# Patient Record
Sex: Female | Born: 1960
Health system: Southern US, Community
[De-identification: ages and names within clinical notes are randomized; demographics above are authoritative.]

## PROBLEM LIST (undated history)

## (undated) DIAGNOSIS — G4700 Insomnia, unspecified: Secondary | ICD-10-CM

## (undated) DIAGNOSIS — R519 Headache, unspecified: Secondary | ICD-10-CM

## (undated) DIAGNOSIS — R7303 Prediabetes: Secondary | ICD-10-CM

## (undated) DIAGNOSIS — E7800 Pure hypercholesterolemia, unspecified: Secondary | ICD-10-CM

## (undated) DIAGNOSIS — N959 Unspecified menopausal and perimenopausal disorder: Secondary | ICD-10-CM

## (undated) DIAGNOSIS — R002 Palpitations: Secondary | ICD-10-CM

## (undated) DIAGNOSIS — F419 Anxiety disorder, unspecified: Secondary | ICD-10-CM

## (undated) DIAGNOSIS — C801 Malignant (primary) neoplasm, unspecified: Secondary | ICD-10-CM

## (undated) HISTORY — PX: UPPER GASTROINTESTINAL ENDOSCOPY: SHX188

## (undated) HISTORY — PX: KNEE SURGERY: SHX244

## (undated) HISTORY — PX: COLONOSCOPY: SHX174

---

## 2007-03-09 ENCOUNTER — Ambulatory Visit: Payer: Self-pay | Admitting: Gastroenterology

## 2008-06-21 ENCOUNTER — Ambulatory Visit: Payer: Self-pay

## 2008-06-23 ENCOUNTER — Ambulatory Visit: Payer: Self-pay | Admitting: Orthopedic Surgery

## 2008-08-29 ENCOUNTER — Ambulatory Visit: Payer: Self-pay | Admitting: Orthopedic Surgery

## 2011-10-02 ENCOUNTER — Ambulatory Visit: Payer: Self-pay

## 2012-10-06 ENCOUNTER — Ambulatory Visit: Payer: Self-pay

## 2013-12-30 ENCOUNTER — Ambulatory Visit: Payer: Self-pay | Admitting: Family Medicine

## 2015-04-19 ENCOUNTER — Ambulatory Visit
Admission: EM | Admit: 2015-04-19 | Discharge: 2015-04-19 | Disposition: A | Discharge status: Home / Self Care | Payer: 59 | Attending: Family Medicine | Admitting: Family Medicine

## 2015-04-19 ENCOUNTER — Encounter: Payer: Self-pay | Admitting: Gynecology

## 2015-04-19 DIAGNOSIS — L03116 Cellulitis of left lower limb: Secondary | ICD-10-CM

## 2015-04-19 HISTORY — DX: Unspecified menopausal and perimenopausal disorder: N95.9

## 2015-04-19 MED ORDER — SULFAMETHOXAZOLE-TRIMETHOPRIM 800-160 MG PO TABS: 1.0000 | ORAL_TABLET | Freq: Two times a day (BID) | ORAL | Status: AC

## 2015-04-19 NOTE — ED Provider Notes (Signed)
CSN: [id number]     Arrival date & time 04/19/15  1642 History   First MD Initiated Contact with Patient 04/19/15 1733     Chief Complaint  Patient presents with  . Suture / Staple Removal   (Consider location/radiation/quality/duration/timing/severity/associated sxs/prior Treatment) HPI Comments: 55 yo female seen at [facility] ER on 04/09/15 after sustaining a fall and anterior left leg laceration. Patient states had 2 superficial sutures placed. States thinks one of them may have fallen off. Complains of some mild pain and slight pus drainage from wound. Denies any fevers or chills.   Patient is a 55 y.o. female presenting with suture removal. The history is provided by the patient.  Suture / Staple Removal    Past Medical History  Diagnosis Date  . Menopausal problem    Past Surgical History  Procedure Laterality Date  . Knee surgery Left    No family history on file. Social History  Substance Use Topics  . Smoking status: Never Smoker   . Smokeless tobacco: None  . Alcohol Use: No   OB History    No data available     Review of Systems  Allergies  Review of patient's allergies indicates no known allergies.  Home Medications   Prior to Admission medications   Medication Sig Start Date End Date Taking? Authorizing Provider  venlafaxine (EFFEXOR) 75 MG tablet Take 75 mg by mouth 2 (two) times daily.   Yes Historical Provider, MD  sulfamethoxazole-trimethoprim (BACTRIM DS,SEPTRA DS) 800-160 MG tablet Take 1 tablet by mouth 2 (two) times daily. 04/19/15   Norval Gable, MD   Meds Ordered and Administered this Visit  Medications - No data to display  BP 121/86 mmHg  Pulse 88  Temp(Src) 98.1 F (36.7 C) (Oral)  Resp 16  Ht 6' (1.829 m)  Wt 180 lb (81.647 kg)  BMI 24.41 kg/m2  SpO2 100% No data found.   Physical Exam  Constitutional: She appears well-developed and well-nourished. No distress.  Skin: She is not diaphoretic.  Left knee normal range of motion;  neurovascularly intact; anterior shin wound healing with mild surrounding blanchable erythema, mild tenderness to palpation and mild drainage  Nursing note and vitals reviewed.   ED Course  Procedures (including critical care time)  Labs Review Labs Reviewed - No data to display  Imaging Review No results found.   Visual Acuity Review  Right Eye Distance:   Left Eye Distance:   Bilateral Distance:    Right Eye Near:   Left Eye Near:    Bilateral Near:         MDM   1. Cellulitis of leg, left   (mild)   New Prescriptions   SULFAMETHOXAZOLE-TRIMETHOPRIM (BACTRIM DS,SEPTRA DS) 800-160 MG TABLET    Take 1 tablet by mouth 2 (two) times daily.   1. diagnosis reviewed with patient; 1 superficial suture removed from wound; no other sutures visualized 2. rx as per orders above; reviewed possible side effects, interactions, risks and benefits  3. Recommend supportive treatment with wound care 4. Follow-up prn if symptoms worsen or don't improve    Norval Gable, MD 04/19/15 [phone]

## 2015-04-19 NOTE — ED Notes (Signed)
Patient c/o fell on 04/09/2015 and injury right shoulder bruise and bilateral palm bruised. Patient stated was seen at [facility] ER on 04/09/2015 and and 2 stiches was place in left knee and told she need to have them taken out in 10 days.

## 2015-05-04 ENCOUNTER — Ambulatory Visit
Admission: EM | Admit: 2015-05-04 | Discharge: 2015-05-04 | Disposition: A | Discharge status: Home / Self Care | Payer: 59 | Attending: Family Medicine | Admitting: Family Medicine

## 2015-05-04 ENCOUNTER — Encounter: Payer: Self-pay | Admitting: Emergency Medicine

## 2015-05-04 ENCOUNTER — Ambulatory Visit (INDEPENDENT_AMBULATORY_CARE_PROVIDER_SITE_OTHER)
Admit: 2015-05-04 | Discharge: 2015-05-04 | Disposition: A | Discharge status: Home / Self Care | Payer: 59 | Attending: Family Medicine | Admitting: Family Medicine

## 2015-05-04 DIAGNOSIS — L03116 Cellulitis of left lower limb: Secondary | ICD-10-CM

## 2015-05-04 DIAGNOSIS — G4482 Headache associated with sexual activity: Secondary | ICD-10-CM | POA: Diagnosis not present

## 2015-05-04 DIAGNOSIS — R519 Headache, unspecified: Secondary | ICD-10-CM

## 2015-05-04 DIAGNOSIS — G43009 Migraine without aura, not intractable, without status migrainosus: Secondary | ICD-10-CM

## 2015-05-04 DIAGNOSIS — R51 Headache: Secondary | ICD-10-CM

## 2015-05-04 DIAGNOSIS — N390 Urinary tract infection, site not specified: Secondary | ICD-10-CM | POA: Diagnosis not present

## 2015-05-04 LAB — URINALYSIS COMPLETE WITH MICROSCOPIC (ARMC ONLY)
BILIRUBIN URINE: NEGATIVE
GLUCOSE, UA: NEGATIVE mg/dL
KETONES UR: NEGATIVE mg/dL
NITRITE: NEGATIVE
Protein, ur: NEGATIVE mg/dL
Specific Gravity, Urine: 1.005 (ref 1.005–1.030)
pH: 5.5 (ref 5.0–8.0)

## 2015-05-04 MED ORDER — PHENAZOPYRIDINE HCL 200 MG PO TABS: 200.0000 mg | ORAL_TABLET | Freq: Three times a day (TID) | ORAL | Status: AC | PRN

## 2015-05-04 MED ORDER — SUMATRIPTAN SUCCINATE 100 MG PO TABS: 100.0000 mg | ORAL_TABLET | Freq: Once | ORAL | Status: AC

## 2015-05-04 MED ORDER — SUMATRIPTAN SUCCINATE 100 MG PO TABS: 100.0000 mg | ORAL_TABLET | ORAL | Status: DC | PRN

## 2015-05-04 MED ORDER — FLUCONAZOLE 150 MG PO TABS: 150.0000 mg | ORAL_TABLET | Freq: Once | ORAL | Status: AC

## 2015-05-04 MED ORDER — MUPIROCIN 2 % EX OINT: 1.0000 "application " | TOPICAL_OINTMENT | Freq: Three times a day (TID) | CUTANEOUS | Status: AC

## 2015-05-04 MED ORDER — LEVOFLOXACIN 500 MG PO TABS: 500.0000 mg | ORAL_TABLET | Freq: Every day | ORAL | Status: AC

## 2015-05-04 NOTE — ED Provider Notes (Signed)
CSN: [id number]     Arrival date & time 05/04/15  [id number] History   First MD Initiated Contact with Patient 05/04/15 [phone]     Nurses notes were reviewed.  Chief Complaint  Patient presents with  . Urinary Tract Infection  . Knee Pain     #1 knee infection. Patient fell a few weeks ago she was seen here after [facility] place on [place]. Patient states she is just got through with Septra a few days ago but she still concerned about the wound she states that when she puts a bandage over it as she was ptosis during the day at work she still noticing some drainage coming from the wound.  #2 she is also concerned by UTI states she started having dysuria strong smelling urine and frequency yesterday and pains worse today. She does write down we talked about the importance of going to the bathroom before she has sexual relations not after she also has a history of yeast infections after taking a box as well and was wondering if this is from yeast infection  #3 post coitus/orgasmic headache. Almost 2 weeks ago patient had sexual relations with her husband and after reaching climax had excruciating headache which caused her to lay down she states that she had nausea lightheadedness and dizziness. The headache lasted for a while and the next day she still felt washed out and drained. She's had a history of migraine headaches before and it felt like when she had her migraine headaches before. 2 days ago she had sexual relations again and that she was about to reach climax she felt that as she put it the way going up into her head and she had her husband stop and the headache did not manifest itself at that time. But she felt like she was about to have a headache. She is looked at [facility] M[facility]. and has some concern of course.    (Consider location/radiation/quality/duration/timing/severity/associated sxs/prior Treatment) Patient is a 55 y.o. female presenting with urinary tract infection, knee pain, abscess, and  migraines. The history is provided by the patient. No language interpreter was used.  Urinary Tract Infection Pain quality:  Sharp Pain severity:  Moderate Progression:  Worsening Chronicity:  New Relieved by:  None tried Ineffective treatments:  Antibiotics Urinary symptoms: discolored urine   Urinary symptoms: no frequent urination   Associated symptoms: no abdominal pain and no vaginal discharge   Risk factors: sexually active   Knee Pain Abscess Location:  Leg Leg abscess location:  L knee Abscess quality: painful, redness and warmth   Progression:  Partially resolved Pain details:    Severity:  Mild Chronicity:  New Relieved by:  Oral antibiotics Worsened by:  Draining/squeezing Associated symptoms: headaches   Headaches:    Severity:  Severe   Onset quality:  Sudden   Progression:  Resolved   Chronicity:  Recurrent Risk factors: no family hx of MRSA, no hx of MRSA and no prior abscess   Migraine This is a new problem. The current episode started more than 1 week ago. Associated symptoms include headaches. Pertinent negatives include no chest pain, no abdominal pain and no shortness of breath. The symptoms are aggravated by intercourse. Nothing relieves the symptoms. She has tried nothing for the symptoms.    Patient does not smoke. There is no family history this pertinent as far as migraines or if UTI is concerned. She does report having gone through the change of life and still having hot flashes  Past Medical History  Diagnosis Date  . Menopausal problem    Past Surgical History  Procedure Laterality Date  . Knee surgery Left    No family history on file. Social History  Substance Use Topics  . Smoking status: Never Smoker   . Smokeless tobacco: None  . Alcohol Use: No   OB History    No data available     Review of Systems  Respiratory: Negative for shortness of breath.   Cardiovascular: Negative for chest pain.  Gastrointestinal: Negative for  abdominal pain.  Genitourinary: Positive for urgency, frequency, difficulty urinating and menstrual problem. Negative for vaginal bleeding, vaginal discharge and vaginal pain.  Skin: Positive for wound.  Neurological: Positive for headaches.  All other systems reviewed and are negative.   Allergies  Review of patient's allergies indicates no known allergies.  Home Medications   Prior to Admission medications   Medication Sig Start Date End Date Taking? Authorizing Provider  fluconazole (DIFLUCAN) 150 MG tablet Take 1 tablet (150 mg total) by mouth once. 05/04/15   Frederich Cha, MD  levofloxacin (LEVAQUIN) 500 MG tablet Take 1 tablet (500 mg total) by mouth daily. 05/04/15   Frederich Cha, MD  mupirocin ointment (BACTROBAN) 2 % Apply 1 application topically 3 (three) times daily. 05/04/15   Frederich Cha, MD  phenazopyridine (PYRIDIUM) 200 MG tablet Take 1 tablet (200 mg total) by mouth 3 (three) times daily as needed for pain. 05/04/15   Frederich Cha, MD  sulfamethoxazole-trimethoprim (BACTRIM DS,SEPTRA DS) 800-160 MG tablet Take 1 tablet by mouth 2 (two) times daily. 04/19/15   Norval Gable, MD  SUMAtriptan (IMITREX) 100 MG tablet Take 1 tablet (100 mg total) by mouth once. May repeat in 2 hours if headache persists or recurs.Can also take an hour before any activities that precipitates migraines. Maximum 2 tablets in 24 hours. 05/04/15   Frederich Cha, MD  venlafaxine (EFFEXOR) 75 MG tablet Take 75 mg by mouth 2 (two) times daily.    Historical Provider, MD   Meds Ordered and Administered this Visit  Medications - No data to display  BP 152/111 mmHg  Pulse 92  Temp(Src) 97.9 F (36.6 C) (Oral)  Resp 18  Ht 6' (1.829 m)  Wt 180 lb (81.647 kg)  BMI 24.41 kg/m2  SpO2 97% No data found.   Physical Exam  Constitutional: She is oriented to person, place, and time. She appears well-developed and well-nourished.  HENT:  Head: Normocephalic and atraumatic.  Right Ear: External ear normal.  Left  Ear: External ear normal.  Eyes: Conjunctivae are normal. Pupils are equal, round, and reactive to light.  Neck: Normal range of motion.  Abdominal: Normal appearance and bowel sounds are normal. There is no hepatosplenomegaly. There is no CVA tenderness.  Musculoskeletal: Normal range of motion. She exhibits edema and tenderness.  Neurological: She is alert and oriented to person, place, and time. She has normal strength and normal reflexes. No cranial nerve deficit or sensory deficit.  Skin: Rash noted. No abrasion and no bruising noted. She is not diaphoretic.     The abrasion laceration is over the and under the left knee. Is a black eschar present over the ulceration and his significant mild redness surrounding the area. She states that is not nearly as tender as it was when she was first seen here but is obscured never healed completely.  Psychiatric: Her behavior is normal. Thought content normal. Her mood appears anxious. Her speech is not delayed and not slurred.  Vitals reviewed.   ED Course  Procedures (including critical care time)  Labs Review Labs Reviewed  URINALYSIS COMPLETEWITH MICROSCOPIC ([facility] ONLY) - Abnormal; Notable for the following:    Color, Urine STRAW (*)    APPearance HAZY (*)    Hgb urine dipstick 2+ (*)    Leukocytes, UA 3+ (*)    Bacteria, UA FEW (*)    Squamous Epithelial / LPF 0-5 (*)    All other components within normal limits  URINE CULTURE    Imaging Review Ct Head Wo Contrast  05/04/2015  CLINICAL DATA:  Severe headache for 2 weeks. EXAM: CT HEAD WITHOUT CONTRAST TECHNIQUE: Contiguous axial images were obtained from the base of the skull through the vertex without intravenous contrast. COMPARISON:  None. FINDINGS: Skull and Sinuses:Negative for fracture or destructive process. Mild mucosal thickening in the bilateral anterior ethmoids. Visualized orbits: Negative. Brain: Normal. No evidence of acute infarction, hemorrhage, hydrocephalus, or mass  lesion/mass effect. IMPRESSION: 1. Negative intracranial imaging. 2. Mild bilateral ethmoid sinusitis. Electronically Signed   By: Monte Fantasia M.D.   On: 05/04/2015 11:23     Visual Acuity Review  Right Eye Distance:   Left Eye Distance:   Bilateral Distance:    Right Eye Near:   Left Eye Near:    Bilateral Near:     Results for orders placed or performed during the hospital encounter of 05/04/15  Urinalysis complete, with microscopic  Result Value Ref Range   Color, Urine STRAW (A) YELLOW   APPearance HAZY (A) CLEAR   Glucose, UA NEGATIVE NEGATIVE mg/dL   Bilirubin Urine NEGATIVE NEGATIVE   Ketones, ur NEGATIVE NEGATIVE mg/dL   Specific Gravity, Urine 1.005 1.005 - 1.030   Hgb urine dipstick 2+ (A) NEGATIVE   pH 5.5 5.0 - 8.0   Protein, ur NEGATIVE NEGATIVE mg/dL   Nitrite NEGATIVE NEGATIVE   Leukocytes, UA 3+ (A) NEGATIVE   RBC / HPF 0-5 0 - 5 RBC/hpf   WBC, UA TOO NUMEROUS TO COUNT 0 - 5 WBC/hpf   Bacteria, UA FEW (A) NONE SEEN   Squamous Epithelial / LPF 0-5 (A) NONE SEEN      MDM   1. Orgasmic headache   2. Headache   3. Migraine without aura and without status migrainosus, not intractable   4. UTI (lower urinary tract infection)   5. Cellulitis of leg, left    #1 infection still present on the knee. Options need to add back pain ointment 3 times a day am also going to put her on a different antibiotic since this probably UTI will place on Levaquin to treat the UTI and the infection of the skin. Recommend returning in about a week for follow-up to 2 weeks #2 UTI patient urine is definite abnormal will get urine culture will treat with Diflucan 1 tablet now and repeat in a week and one refill. We'll place on Levaquin 501 tablet day for 10 days and we will place on peridium 200 mg 3 times a day #3 headache/migraine post cotial/orgasmic since she didn't have full-blown headache 2 days ago I'm not going to work by trying to do or recommend an LP. Will get CT scan  since the incident occurred 1-2 weeks ago aneurysm bleed she is to be present. If CT scan is negative will place her on Imitrex to take 1-2 hours before cotial 2 break the cycle of headaches with sex and also to keep her from developing paranoia of sex as well. I did  explain to her that if her headaches still persist and I would recommend neurological evaluation possible MRI and possible LP that time.b y neurologist   Frederich Cha, MD 05/04/15 1219

## 2015-05-04 NOTE — Discharge Instructions (Signed)
Cellulitis Cellulitis is an infection of the skin and the tissue under the skin. The infected area is usually red and tender. This happens most often in the arms and lower legs. HOME CARE   Take your antibiotic medicine as told. Finish the medicine even if you start to feel better.  Keep the infected arm or leg raised (elevated).  Put a warm cloth on the area up to 4 times per day.  Only take medicines as told by your doctor.  Keep all doctor visits as told. GET HELP IF:  You see red streaks on the skin coming from the infected area.  Your red area gets bigger or turns a dark color.  Your bone or joint under the infected area is painful after the skin heals.  Your infection comes back in the same area or different area.  You have a puffy (swollen) bump in the infected area.  You have new symptoms.  You have a fever. GET HELP RIGHT AWAY IF:   You feel very sleepy.  You throw up (vomit) or have watery poop (diarrhea).  You feel sick and have muscle aches and pains.   This information is not intended to replace advice given to you by your health care provider. Make sure you discuss any questions you have with your health care provider.   Document Released: 09/18/2007 Document Revised: 12/21/2014 Document Reviewed: 06/17/2011 Elsevier Interactive Patient Education 2016 [employer].  Migraine Headache A migraine headache is very bad, throbbing pain on one or both sides of your head. Talk to your doctor about what things may bring on (trigger) your migraine headaches. HOME CARE  Only take medicines as told by your doctor.  Lie down in a dark, quiet room when you have a migraine.  Keep a journal to find out if certain things bring on migraine headaches. For example, write down:  What you eat and drink.  How much sleep you get.  Any change to your diet or medicines.  Lessen how much alcohol you drink.  Quit smoking if you smoke.  Get enough sleep.  Lessen any  stress in your life.  Keep lights dim if bright lights bother you or make your migraines worse. GET HELP RIGHT AWAY IF:   Your migraine becomes really bad.  You have a fever.  You have a stiff neck.  You have trouble seeing.  Your muscles are weak, or you lose muscle control.  You lose your balance or have trouble walking.  You feel like you will pass out (faint), or you pass out.  You have really bad symptoms that are different than your first symptoms. MAKE SURE YOU:   Understand these instructions.  Will watch your condition.  Will get help right away if you are not doing well or get worse.   This information is not intended to replace advice given to you by your health care provider. Make sure you discuss any questions you have with your health care provider.   Document Released: 01/09/2008 Document Revised: 06/24/2011 Document Reviewed: 12/07/2012 Elsevier Interactive Patient Education 2016 Elsevier Inc.  Urinary Tract Infection A urinary tract infection (UTI) can occur any place along the urinary tract. The tract includes the kidneys, ureters, bladder, and urethra. A type of germ called bacteria often causes a UTI. UTIs are often helped with antibiotic medicine.  HOME CARE   If given, take antibiotics as told by your doctor. Finish them even if you start to feel better.  Drink enough fluids  to keep your pee (urine) clear or pale yellow.  Avoid tea, drinks with caffeine, and bubbly (carbonated) drinks.  Pee often. Avoid holding your pee in for a long time.  Pee before and after having sex (intercourse).  Wipe from front to back after you poop (bowel movement) if you are a woman. Use each tissue only once. GET HELP RIGHT AWAY IF:   You have back pain.  You have lower belly (abdominal) pain.  You have chills.  You feel sick to your stomach (nauseous).  You throw up (vomit).  Your burning or discomfort with peeing does not go away.  You have a  fever.  Your symptoms are not better in 3 days. MAKE SURE YOU:   Understand these instructions.  Will watch your condition.  Will get help right away if you are not doing well or get worse.   This information is not intended to replace advice given to you by your health care provider. Make sure you discuss any questions you have with your health care provider.   Document Released: 09/18/2007 Document Revised: 04/22/2014 Document Reviewed: 10/31/2011 Elsevier Interactive Patient Education [employer].

## 2015-05-04 NOTE — ED Notes (Signed)
Pt head ct completed.

## 2015-05-04 NOTE — ED Notes (Signed)
Pt currently having pain and burning when urinating, has been on some antibiotics recently. Also here with left knee pain from a fall on Christmas was seen here for same and put on antibiotics for possible infection to left knee.  Wants to make sure her knee is healing as it is supposed to be.

## 2015-05-06 LAB — URINE CULTURE
Culture: >=100000
Special Requests: NORMAL

## 2015-05-08 ENCOUNTER — Telehealth: Payer: Self-pay | Admitting: *Deleted

## 2015-05-08 NOTE — ED Notes (Signed)
Called patient, no answer. Left message on her answering machine to call if she is not improving.

## 2015-05-15 ENCOUNTER — Ambulatory Visit
Admission: EM | Admit: 2015-05-15 | Discharge: 2015-05-15 | Disposition: A | Discharge status: Home / Self Care | Payer: 59 | Attending: Family Medicine | Admitting: Family Medicine

## 2015-05-15 ENCOUNTER — Encounter: Payer: Self-pay | Admitting: *Deleted

## 2015-05-15 DIAGNOSIS — L03116 Cellulitis of left lower limb: Secondary | ICD-10-CM

## 2015-05-15 DIAGNOSIS — Z5189 Encounter for other specified aftercare: Secondary | ICD-10-CM

## 2015-05-15 MED ORDER — MUPIROCIN 2 % EX OINT: TOPICAL_OINTMENT | CUTANEOUS | Status: AC

## 2015-05-15 NOTE — Discharge Instructions (Signed)
Keep clean. Continue to clean and apply antibiotic ointment as discussed. Monitor closely.   Follow up with your primary care physician this week as needed.   Return to Urgent care as needed for redness, swelling, drainage, pain, new or worsening concerns.

## 2015-05-15 NOTE — ED Provider Notes (Signed)
[facility]  ____________________________________________  Time seen: Approximately 9:32 AM  I have reviewed the triage vital signs and the nursing notes.   HISTORY  Chief Complaint Wound Check  HPI Paige Bond is a 55 y.o. female presents for the complaint of wound check to left anterior knee. Patient reports that on Christmas Day this past year she was playing with her grandchild and tripped and fell. Patient reports that she was then seen in [facility] ER which she had left knee wound repaired with 2 internal stitches and 2 external stitches, reports sutures removed previously. Patient reports that she has been on antibiotics twice since then. States when her on antibiotics from the ER at that time as well as 1 course of antibiotics approximate 2 weeks ago that was treating the left knee infection as well as a UTI. Patient reports that last antibiotic was Levaquin. Unsure of initial antibiotic name. Patient reports that she is here to make sure things are healing well. Reports had left knee xray which was reported to be negative when seen in ER.   Patient reports that she is continuing to apply topical antibiotic as directed. Patient reports that overall she believes that the wound is healing and looking much better. Patient reports that since completing last antibiotic, she has had no further drainage from the wound. Patient states that the redness has dramatically improved. Patient states that the left anterior knee wound does occasionally feel painful which is described as tightness 3/10 at the scabbing site and with some tenderness directly to palpation. Denies surrounding pain. denies any pain with ambulation or daily activities. Denies any pain radiation. Denies calf pain or distal swelling. Denies fevers. Patient reports her tetanus immunization was updated on initial ER visit. Denies other complaints.  [facility]   Past Medical History  Diagnosis Date  .  Menopausal problem     There are no active problems to display for this patient.   Past Surgical History  Procedure Laterality Date  . Knee surgery Left     Current Outpatient Rx  Name  Route  Sig  Dispense  Refill  .           Marland Kitchen mupirocin ointment (BACTROBAN) 2 %   Topical   Apply 1 application topically 3 (three) times daily.   22 g   0   . venlafaxine (EFFEXOR) 75 MG tablet   Oral   Take 75 mg by mouth 2 (two) times daily.         .           .           .           Marland Kitchen SUMAtriptan (IMITREX) 100 MG tablet   Oral   Take 1 tablet (100 mg total) by mouth once. May repeat in 2 hours if headache persists or recurs.Can also take an hour before any activities that precipitates migraines. Maximum 2 tablets in 24 hours.   10 tablet   0     Allergies Review of patient's allergies indicates no known allergies.  History reviewed. No pertinent family history.  Social History Social History  Substance Use Topics  . Smoking status: Never Smoker   . Smokeless tobacco: None  . Alcohol Use: No    Review of Systems Constitutional: No fever/chills Eyes: No visual changes. ENT: No sore throat. Cardiovascular: Denies chest pain. Respiratory: Denies shortness of breath. Gastrointestinal: No abdominal pain.  No nausea, no  vomiting.  No diarrhea.  No constipation. Genitourinary: Negative for dysuria. Musculoskeletal: Negative for back pain. Skin: Negative for rash. Left anterior knee wound.  Neurological: Negative for headaches, focal weakness or numbness.  10-point ROS otherwise negative.  ____________________________________________   PHYSICAL EXAM:  VITAL SIGNS: ED Triage Vitals  Enc Vitals Group     BP 05/15/15 0907 142/90 mmHg     Pulse Rate 05/15/15 0907 84     Resp 05/15/15 0907 18     Temp 05/15/15 0907 97.8 F (36.6 C)     Temp Source 05/15/15 0907 Oral     SpO2 05/15/15 0907 99 %     Weight 05/15/15 0907 180 lb (81.647 kg)     Height 05/15/15 0907 6'  (1.829 m)     Head Cir --      Peak Flow --      Pain Score 05/15/15 0912 0     Pain Loc --      Pain Edu? --      Excl. in [place]? --     Constitutional: Alert and oriented. Well appearing and in no acute distress. Eyes: Conjunctivae are normal. PERRL. EOMI. Head: Atraumatic  Nose: No congestion/rhinnorhea.  Mouth/Throat: Mucous membranes are moist.  Oropharynx non-erythematous. Cardiovascular: Normal rate, regular rhythm. Grossly normal heart sounds.  Good peripheral circulation. Respiratory: Normal respiratory effort.  No retractions. Lungs CTAB. Gastrointestinal: Soft and nontender.  Musculoskeletal: No lower or upper extremity tenderness nor edema.   Bilateral pedal pulses equal and easily palpated.  Except:  Inferior to left anterior knee area of 3 cm x 1.5 cm healing wound with crusted area, mild swelling, minimal erythema at direct wound edge margin, no surrounding erythema, no fluctuance, no induration, no drainage or exudate, mild tenderness at direct palpation of wound, no surrounding tenderness, full range of motion to the left knee, no bony tenderness, no foreign bodies visualized, no lower extremity swelling, no calf tenderness bilaterally, steady gait. Neurologic:  Normal speech and language. No gross focal neurologic deficits are appreciated. No gait instability. Skin:  Skin is warm, dry and intact. No rash noted. Psychiatric: Mood and affect are normal. Speech and behavior are normal.  ____________________________________________   LABS (all labs ordered are listed, but only abnormal results are displayed)  Labs Reviewed - No data to display    INITIAL IMPRESSION / ASSESSMENT AND PLAN / ED COURSE  Pertinent labs & imaging results that were available during my care of the patient were reviewed by me and considered in my medical decision making (see chart for details).  Very well-appearing patient. No acute distress. Presents for the complaints of left anterior knee  wound check. Patient was seen in urgent care approximately 2 weeks ago for follow-up of the same. Patient treated with oral Levaquin for wound infection as well as urinary tract infection, patient has also been applying topical Bactroban. Wound healing. No signs of active infection. No fluctuance, induration, drainage. wound with minimal erythema directly surrounding wound borders. Discussed in detail with patient to continue monitoring wound at home. Also to continue applying topical Bactroban and cleaning daily. Discussed that do not feel that oral antibiotics are indicated at this time. Follow-up with primary care physician as needed.  Discussed follow up with Primary care physician this week. Discussed follow up and return parameters including swelling, redness, drainage, pain, no resolution or any worsening concerns. Patient verbalized understanding and agreed to plan.   ____________________________________________   FINAL CLINICAL IMPRESSION(S) / ED DIAGNOSES  Final  diagnoses:  Encounter for wound re-check      Note: This dictation was prepared with Dragon dictation along with smaller phrase technology. Any transcriptional errors that result from this process are unintentional.    Marylene Land, NP 05/15/15 1103

## 2015-05-15 NOTE — ED Notes (Signed)
Patient is coming in to have wound on left knee reassessed.

## 2015-06-13 ENCOUNTER — Ambulatory Visit
Admission: EM | Admit: 2015-06-13 | Discharge: 2015-06-13 | Disposition: A | Discharge status: Home / Self Care | Payer: 59 | Attending: Family Medicine | Admitting: Family Medicine

## 2015-06-13 ENCOUNTER — Encounter: Payer: Self-pay | Admitting: *Deleted

## 2015-06-13 DIAGNOSIS — J101 Influenza due to other identified influenza virus with other respiratory manifestations: Secondary | ICD-10-CM | POA: Diagnosis not present

## 2015-06-13 LAB — RAPID INFLUENZA A&B ANTIGENS
Influenza A (ARMC): DETECTED — AB
Influenza B (ARMC): NOT DETECTED — AB

## 2015-06-13 MED ORDER — HYDROCOD POLST-CPM POLST ER 10-8 MG/5ML PO SUER: 5.0000 mL | Freq: Every evening | ORAL | Status: AC | PRN

## 2015-06-13 MED ORDER — OSELTAMIVIR PHOSPHATE 75 MG PO CAPS
75.0000 mg | ORAL_CAPSULE | Freq: Two times a day (BID) | ORAL | Status: AC
Start: 2015-06-13 — End: ?

## 2015-06-13 NOTE — ED Provider Notes (Signed)
[facility]  ____________________________________________  Time seen: Approximately 9:15 AM  I have reviewed the triage vital signs and the nursing notes.   HISTORY  Chief Complaint Cough; Fever; Sore Throat; Generalized Body Aches; and Otalgia   HPI Paige Bond is a 55 y.o. female presents with complaints of 2 days of runny nose, nasal congestion, cough, body aches and subjective fever. Reports that around several sick contacts recently including her mother as well as her brother. Also reports several coworkers in the last few days out of work due to sicknesses including the flu. Reports continues to eat and drink well. Has taken over-the-counter Tylenol as well as some cough and congestion medications which helped some per patient.  Denies chest pain, shortness breath, abdominal pain, dizziness, weakness, dysuria, neck or back pain.     Past Medical History  Diagnosis Date  . Menopausal problem     There are no active problems to display for this patient.   Past Surgical History  Procedure Laterality Date  . Knee surgery Left     Current Outpatient Rx  Name  Route  Sig  Dispense  Refill  . Phenylephrine-DM-GG-APAP (MUCINEX CHILD MULTI-SYMPTOM PO)   Oral   Take by mouth.         . venlafaxine (EFFEXOR) 75 MG tablet   Oral   Take 75 mg by mouth 2 (two) times daily.                               .           .           .           .           Marland Kitchen SUMAtriptan (IMITREX) 100 MG tablet   Oral   Take 1 tablet (100 mg total) by mouth once. May repeat in 2 hours if headache persists or recurs.Can also take an hour before any activities that precipitates migraines. Maximum 2 tablets in 24 hours.   10 tablet   0     Allergies Review of patient's allergies indicates no known allergies.  History reviewed. No pertinent family history.  Social History Social History  Substance Use Topics  . Smoking status: Former Research scientist (life sciences)  . Smokeless  tobacco: None  . Alcohol Use: No    Review of Systems Constitutional:positive chills Eyes: No visual changes. ENT: No sore throat.Positive runny nose, cough and congestion Cardiovascular: Denies chest pain. Respiratory: Denies shortness of breath. Gastrointestinal: No abdominal pain.  No nausea, no vomiting.  No diarrhea.  No constipation. Genitourinary: Negative for dysuria. Musculoskeletal: Negative for back pain. Skin: Negative for rash. Neurological: Negative for headaches, focal weakness or numbness.  10-point ROS otherwise negative.  ____________________________________________   PHYSICAL EXAM:  VITAL SIGNS: ED Triage Vitals  Enc Vitals Group     BP 06/13/15 0838 132/76 mmHg     Pulse Rate 06/13/15 0838 99     Resp 06/13/15 0838 16     Temp 06/13/15 0838 98.1 F (36.7 C)     Temp Source 06/13/15 0838 Oral     SpO2 06/13/15 0838 99 %     Weight 06/13/15 0838 180 lb (81.647 kg)     Height 06/13/15 0838 6' (1.829 m)     Head Cir --      Peak Flow --      Pain Score --  Pain Loc --      Pain Edu? --      Excl. in [place]? --   Constitutional: Alert and oriented. Well appearing and in no acute distress. Eyes: Conjunctivae are normal. PERRL. EOMI. Head: Atraumatic. No sinus tenderness to palpation. No swelling. No erythema.  Ears: no erythema, normal TMs bilaterally.   Nose:Nasal congestion with clear rhinorrhea  Mouth/Throat: Mucous membranes are moist. No pharyngeal erythema. No tonsillar swelling or exudate.  Neck: No stridor.  No cervical spine tenderness to palpation. Hematological/Lymphatic/Immunilogical: No cervical lymphadenopathy. Cardiovascular: Normal rate, regular rhythm. Grossly normal heart sounds.  Good peripheral circulation. Respiratory: Normal respiratory effort.  No retractions. Lungs CTAB.No wheezes, rales or rhonchi. Good air movement.  Gastrointestinal: Soft and nontender. Normal Bowel sounds. No CVA tenderness. Musculoskeletal: No lower or  upper extremity tenderness nor edema. No cervical, thoracic or lumbar tenderness to palpation. Neurologic:  Normal speech and language. No gross focal neurologic deficits are appreciated. No gait instability. Skin:  Skin is warm, dry and intact. No rash noted. Psychiatric: Mood and affect are normal. Speech and behavior are normal.   _______________________________________   LABS (all labs ordered are listed, but only abnormal results are displayed)  Labs Reviewed  RAPID INFLUENZA A&B ANTIGENS ([place]) - Abnormal; Notable for the following:    Influenza A ([facility]) DETECTED (*)    Influenza B ([facility]) NOT DETECTED (*)    All other components within normal limits    INITIAL IMPRESSION / ASSESSMENT AND PLAN / ED COURSE  Pertinent labs & imaging results that were available during my care of the patient were reviewed by me and considered in my medical decision making (see chart for details).  Well-appearing patient. No acute distress. Presents for the complaints of 2 days of runny nose, nasal congestion, cough, body aches and fevers. Reports continues to eat and drink well. Lungs clear throughout. Abdomen soft and nontender. Moist mucous membranes. Suspect viral upper respiratory infection/influenza.  Influenza A positive. Discussed treatment with oral Tamiflu. Patient requests treatment for Tamiflu. Discussed indication, risks and benefits of medications with patient. Will treat with oral Tamiflu, when necessary Tussionex daily at bedtime, rest, fluids, over-the-counter Tylenol or ibuprofen as needed. Follow with PCP. Work note given for today and tomorrow.  Discussed follow up with Primary care physician this week. Discussed follow up and return parameters including no resolution or any worsening concerns. Patient verbalized understanding and agreed to plan.   ____________________________________________   FINAL CLINICAL IMPRESSION(S) / ED DIAGNOSES  Final diagnoses:  Influenza A       Note: This dictation was prepared with Dragon dictation along with smaller phrase technology. Any transcriptional errors that result from this process are unintentional.    Marylene Land, NP 06/13/15 [phone]

## 2015-06-13 NOTE — Discharge Instructions (Signed)
Take medication as prescribed. Rest. Drink plenty of fluids.   Follow up with your primary care physician this week as needed. Return to Urgent care for new or worsening concerns.   Influenza, Adult Influenza ("the flu") is a viral infection of the respiratory tract. It occurs more often in winter months because people spend more time in close contact with one another. Influenza can make you feel very sick. Influenza easily spreads from person to person (contagious). CAUSES  Influenza is caused by a virus that infects the respiratory tract. You can catch the virus by breathing in droplets from an infected person's cough or sneeze. You can also catch the virus by touching something that was recently contaminated with the virus and then touching your mouth, nose, or eyes. RISKS AND COMPLICATIONS You may be at risk for a more severe case of influenza if you smoke cigarettes, have diabetes, have chronic heart disease (such as heart failure) or lung disease (such as asthma), or if you have a weakened immune system. Elderly people and pregnant women are also at risk for more serious infections. The most common problem of influenza is a lung infection (pneumonia). Sometimes, this problem can require emergency medical care and may be life threatening. SIGNS AND SYMPTOMS  Symptoms typically last 4 to 10 days and may include:  Fever.  Chills.  Headache, body aches, and muscle aches.  Sore throat.  Chest discomfort and cough.  Poor appetite.  Weakness or feeling tired.  Dizziness.  Nausea or vomiting. DIAGNOSIS  Diagnosis of influenza is often made based on your history and a physical exam. A nose or throat swab test can be done to confirm the diagnosis. TREATMENT  In mild cases, influenza goes away on its own. Treatment is directed at relieving symptoms. For more severe cases, your health care provider may prescribe antiviral medicines to shorten the sickness. Antibiotic medicines are not  effective because the infection is caused by a virus, not by bacteria. HOME CARE INSTRUCTIONS  Take medicines only as directed by your health care provider.  Use a cool mist humidifier to make breathing easier.  Get plenty of rest until your temperature returns to normal. This usually takes 3 to 4 days.  Drink enough fluid to keep your urine clear or pale yellow.  Cover yourmouth and nosewhen coughing or sneezing,and wash your handswellto prevent thevirusfrom spreading.  Stay homefromwork orschool untilthe fever is gonefor at least 48full day. PREVENTION  An annual influenza vaccination (flu shot) is the best way to avoid getting influenza. An annual flu shot is now routinely recommended for all adults in the [place] IF:  You experiencechest pain, yourcough worsens,or you producemore mucus.  Youhave nausea,vomiting, ordiarrhea.  Your fever returns or gets worse. SEEK IMMEDIATE MEDICAL CARE IF:  You havetrouble breathing, you become short of breath,or your skin ornails becomebluish.  You have severe painor stiffnessin the neck.  You develop a sudden headache, or pain in the face or ear.  You have nausea or vomiting that you cannot control. MAKE SURE YOU:   Understand these instructions.  Will watch your condition.  Will get help right away if you are not doing well or get worse.   This information is not intended to replace advice given to you by your health care provider. Make sure you discuss any questions you have with your health care provider.   Document Released: 03/29/2000 Document Revised: 04/22/2014 Document Reviewed: 07/01/2011 Elsevier Interactive Patient Education [employer].

## 2015-06-13 NOTE — ED Notes (Signed)
Non-productive cough, sore throat, fever, right ear pain since Saturday. Close recent contact with her mother who was recently dx with flu.

## 2015-08-29 ENCOUNTER — Ambulatory Visit
Admission: EM | Admit: 2015-08-29 | Discharge: 2015-08-29 | Disposition: A | Discharge status: Home / Self Care | Payer: 59 | Attending: Family Medicine | Admitting: Family Medicine

## 2015-08-29 ENCOUNTER — Encounter: Payer: Self-pay | Admitting: *Deleted

## 2015-08-29 DIAGNOSIS — L259 Unspecified contact dermatitis, unspecified cause: Secondary | ICD-10-CM | POA: Diagnosis not present

## 2015-08-29 MED ORDER — PREDNISONE 10 MG (21) PO TBPK: ORAL_TABLET | ORAL | Status: AC

## 2015-08-29 MED ORDER — RANITIDINE HCL 150 MG PO CAPS: 150.0000 mg | ORAL_CAPSULE | Freq: Two times a day (BID) | ORAL | Status: AC | PRN

## 2015-08-29 MED ORDER — LORATADINE 10 MG PO TABS: 10.0000 mg | ORAL_TABLET | Freq: Every day | ORAL | Status: AC | PRN

## 2015-08-29 NOTE — ED Provider Notes (Signed)
CSN: [id number]     Arrival date & time 08/29/15  [id number] History   First MD Initiated Contact with Patient 08/29/15 0915    Nurses notes were reviewed. Chief Complaint  Patient presents with  . Rash    Patient reports rash on both lower legs she had an episode of poison ivy and poison oak which she is not completely recovered with earlier this spring. She states that she was at the ER again this weekend and now she isanother type of rash on both lower legs as well. While the poison ivy and poison oak was more patch-like rash on both legs this one is more of a diverticuli type rash on both lower extremities.  Started noticing a Sunday and the rash seems to be spreading from her calf up to her back and thighs and also on the front of her lower leg as well. She states very pruritic in nature.   Postmenopausal and she is a former smoker. No pertinent family medical history to this visit and no known drug allergies. She still trying Imitrex for her postcoital headaches I did discuss with her that she might need to see her PCP and be placed on beta blockers if her headaches persist.   (Consider location/radiation/quality/duration/timing/severity/associated sxs/prior Treatment) Patient is a 55 y.o. female presenting with rash. The history is provided by the patient. No language interpreter was used.  Rash Location:  Leg and toe Leg rash location:  R lower leg and L lower leg Quality: itchiness and redness   Severity:  Moderate Onset quality:  Sudden Progression:  Spreading Chronicity:  New Context: plant contact and sun exposure   Relieved by:  Nothing Ineffective treatments:  Antihistamines Associated symptoms: headaches   Associated symptoms: no shortness of breath, no sore throat and not wheezing     Past Medical History  Diagnosis Date  . Menopausal problem    Past Surgical History  Procedure Laterality Date  . Knee surgery Left    History reviewed. No pertinent family  history. Social History  Substance Use Topics  . Smoking status: Former Research scientist (life sciences)  . Smokeless tobacco: Never Used  . Alcohol Use: No   OB History    No data available     Review of Systems  HENT: Negative for sore throat.   Respiratory: Negative for shortness of breath and wheezing.   Skin: Positive for rash.  Neurological: Positive for headaches.  All other systems reviewed and are negative.   Allergies  Review of patient's allergies indicates no known allergies.  Home Medications   Prior to Admission medications   Medication Sig Start Date End Date Taking? Authorizing Provider  venlafaxine (EFFEXOR) 75 MG tablet Take 75 mg by mouth 2 (two) times daily.   Yes Historical Provider, MD  fluconazole (DIFLUCAN) 150 MG tablet Take 1 tablet (150 mg total) by mouth once. 05/04/15   Frederich Cha, MD  levofloxacin (LEVAQUIN) 500 MG tablet Take 1 tablet (500 mg total) by mouth daily. 05/04/15   Frederich Cha, MD  loratadine (CLARITIN) 10 MG tablet Take 1 tablet (10 mg total) by mouth daily as needed for allergies. Take 1 tablet in the morning. As needed for itching. 08/29/15   Frederich Cha, MD  mupirocin ointment (BACTROBAN) 2 % Apply 1 application topically 3 (three) times daily. 05/04/15   Frederich Cha, MD  mupirocin ointment (BACTROBAN) 2 % Apply three times a day for 10 days. 05/15/15   Marylene Land, NP  oseltamivir (TAMIFLU) 75 MG capsule Take  1 capsule (75 mg total) by mouth every 12 (twelve) hours. 06/13/15   Marylene Land, NP  phenazopyridine (PYRIDIUM) 200 MG tablet Take 1 tablet (200 mg total) by mouth 3 (three) times daily as needed for pain. 05/04/15   Frederich Cha, MD  Phenylephrine-DM-GG-APAP [facility] CHILD MULTI-SYMPTOM PO) Take by mouth.    Historical Provider, MD  predniSONE (STERAPRED UNI-PAK 21 TAB) 10 MG (21) TBPK tablet Sig 6 tablet day 1, 5 tablets day 2, 4 tablets day 3,,3tablets day 4, 2 tablets day 5, 1 tablet day 6 take all tablets orally 08/29/15   Frederich Cha, MD  ranitidine  (ZANTAC) 150 MG capsule Take 1 capsule (150 mg total) by mouth 2 (two) times daily as needed for heartburn. 08/29/15   Frederich Cha, MD  sulfamethoxazole-trimethoprim (BACTRIM DS,SEPTRA DS) 800-160 MG tablet Take 1 tablet by mouth 2 (two) times daily. 04/19/15   Norval Gable, MD  SUMAtriptan (IMITREX) 100 MG tablet Take 1 tablet (100 mg total) by mouth once. May repeat in 2 hours if headache persists or recurs.Can also take an hour before any activities that precipitates migraines. Maximum 2 tablets in 24 hours. 05/04/15   Frederich Cha, MD   Meds Ordered and Administered this Visit  Medications - No data to display  BP 144/85 mmHg  Pulse 77  Temp(Src) 98.1 F (36.7 C) (Oral)  Resp 18  Ht 6' (1.829 m)  Wt 182 lb (82.555 kg)  BMI 24.68 kg/m2  SpO2 97% No data found.   Physical Exam  Constitutional: She is oriented to person, place, and time. She appears well-developed and well-nourished.  HENT:  Head: Normocephalic and atraumatic.  Eyes: Conjunctivae are normal. Pupils are equal, round, and reactive to light.  Neck: Neck supple.  Musculoskeletal: Normal range of motion.  Neurological: She is alert and oriented to person, place, and time.  Skin: Skin is warm. Rash noted.     She has a poison ivy-like rash on both plantar surfaces of the lower legs however she also has a rash that looks more petechiae in nature with some mild blanching on both the ventral and dorsal aspect of the leg with the worse on the back of the legs and is spreading from the lower legs to the upper leg as well.  Psychiatric: She has a normal mood and affect.  Vitals reviewed.   ED Course  Procedures (including critical care time)  Labs Review Labs Reviewed - No data to display  Imaging Review No results found.   Visual Acuity Review  Right Eye Distance:   Left Eye Distance:   Bilateral Distance:    Right Eye Near:   Left Eye Near:    Bilateral Near:         MDM   1. Contact dermatitis     The patient is having worsening contact dermatitis. She's has a new reaction top of the previous poison ivy/poison oak reaction. Would recommend 12 day course of prednisone stop the Benadryl since it is making her sleepy Claritin 10 mg 1 tablet daily and Zantac 150 milligrams twice a day as needed for itching.   Also recommend follow-up with her PCP for possible beta blocker initiation or referral to neurologist for the remaining or recurring orgasmic  headaches that she's having despite pre Imitrex treatments  Frederich Cha, MD 08/29/15 1046

## 2015-08-29 NOTE — ED Notes (Signed)
Patient started having symptom of rash on her lower legs bilaterally 2 days ago.  Red pinpoint rash is visible. Patient reports working in her yard 2 days ago.

## 2015-08-29 NOTE — Discharge Instructions (Signed)
Allergies An allergy is when your body reacts to a substance in a way that is not normal. An allergic reaction can happen after you:  Eat something.  Breathe in something.  Touch something. WHAT KINDS OF ALLERGIES ARE THERE? You can be allergic to:  Things that are only around during certain seasons, like molds and pollens.  Foods.  Drugs.  Insects.  Animal dander. WHAT ARE SYMPTOMS OF ALLERGIES?  Puffiness (swelling). This may happen on the lips, face, tongue, mouth, or throat.  Sneezing.  Coughing.  Breathing loudly (wheezing).  Stuffy nose.  Tingling in the mouth.  A rash.  Itching.  Itchy, red, puffy areas of skin (hives).  Watery eyes.  Throwing up (vomiting).  Watery poop (diarrhea).  Dizziness.  Feeling faint or fainting.  Trouble breathing or swallowing.  A tight feeling in the chest.  A fast heartbeat. HOW ARE ALLERGIES DIAGNOSED? Allergies can be diagnosed with:  A medical and family history.  Skin tests.  Blood tests.  A food diary. A food diary is a record of all the foods, drinks, and symptoms you have each day.  The results of an elimination diet. This diet involves making sure not to eat certain foods and then seeing what happens when you start eating them again. HOW ARE ALLERGIES TREATED? There is no cure for allergies, but allergic reactions can be treated with medicine. Severe reactions usually need to be treated at a hospital.  HOW CAN REACTIONS BE PREVENTED? The best way to prevent an allergic reaction is to avoid the thing you are allergic to. Allergy shots and medicines can also help prevent reactions in some cases.   This information is not intended to replace advice given to you by your health care provider. Make sure you discuss any questions you have with your health care provider.   Document Released: 07/27/2012 Document Revised: 04/22/2014 Document Reviewed: 01/11/2014 Elsevier Interactive Patient Education 2016  Elsevier [employer]  Rash A rash is a change in the color or feel of your skin. There are many different types of rashes. You may have other problems along with your rash. HOME CARE  Avoid the thing that caused your rash.  Do not scratch your rash.  You may take cools baths to help stop itching.  Only take medicines as told by your doctor.  Keep all doctor visits as told. GET HELP RIGHT AWAY IF:   Your pain, puffiness (swelling), or redness gets worse.  You have a fever.  You have new or severe problems.  You have body aches, watery poop (diarrhea), or you throw up (vomit).  Your rash is not better after 3 days. MAKE SURE YOU:   Understand these instructions.  Will watch your condition.  Will get help right away if you are not doing well or get worse.   This information is not intended to replace advice given to you by your health care provider. Make sure you discuss any questions you have with your health care provider.   Document Released: 09/18/2007 Document Revised: 06/24/2011 Document Reviewed: 08/17/2014 Elsevier Interactive Patient Education 2016 [place] Dermatitis Dermatitis is redness, soreness, and swelling (inflammation) of the skin. Contact dermatitis is a reaction to certain substances that touch the skin. There are two types of contact dermatitis:   Irritant contact dermatitis. This type is caused by something that irritates your skin, such as dry hands from washing them too much. This type does not require previous exposure to the substance for a  reaction to occur. This type is more common.  Allergic contact dermatitis. This type is caused by a substance that you are allergic to, such as a nickel allergy or poison ivy. This type only occurs if you have been exposed to the substance (allergen) before. Upon a repeat exposure, your body reacts to the substance. This type is less common. CAUSES  Many different substances can cause contact dermatitis.  Irritant contact dermatitis is most commonly caused by exposure to:   Makeup.   Soaps.   Detergents.   Bleaches.   Acids.   Metal salts, such as nickel.  Allergic contact dermatitis is most commonly caused by exposure to:   Poisonous plants.   Chemicals.   Jewelry.   Latex.   Medicines.   Preservatives in products, such as clothing.  RISK FACTORS This condition is more likely to develop in:   People who have jobs that expose them to irritants or allergens.  People who have certain medical conditions, such as asthma or eczema.  SYMPTOMS  Symptoms of this condition may occur anywhere on your body where the irritant has touched you or is touched by you. Symptoms include:  Dryness or flaking.   Redness.   Cracks.   Itching.   Pain or a burning feeling.   Blisters.  Drainage of small amounts of blood or clear fluid from skin cracks. With allergic contact dermatitis, there may also be swelling in areas such as the eyelids, mouth, or genitals.  DIAGNOSIS  This condition is diagnosed with a medical history and physical exam. A patch skin test may be performed to help determine the cause. If the condition is related to your job, you may need to see an occupational medicine specialist. TREATMENT Treatment for this condition includes figuring out what caused the reaction and protecting your skin from further contact. Treatment may also include:   Steroid creams or ointments. Oral steroid medicines may be needed in more severe cases.  Antibiotics or antibacterial ointments, if a skin infection is present.  Antihistamine lotion or an antihistamine taken by mouth to ease itching.  A bandage (dressing). HOME CARE INSTRUCTIONS Skin Care  Moisturize your skin as needed.   Apply cool compresses to the affected areas.  Try taking a bath with:  Epsom salts. Follow the instructions on the packaging. You can get these at your local pharmacy or  grocery store.  Baking soda. Pour a small amount into the bath as directed by your health care provider.  Colloidal oatmeal. Follow the instructions on the packaging. You can get this at your local pharmacy or grocery store.  Try applying baking soda paste to your skin. Stir water into baking soda until it reaches a paste-like consistency.  Do not scratch your skin.  Bathe less frequently, such as every other day.  Bathe in lukewarm water. Avoid using hot water. Medicines  Take or apply over-the-counter and prescription medicines only as told by your health care provider.   If you were prescribed an antibiotic medicine, take or apply your antibiotic as told by your health care provider. Do not stop using the antibiotic even if your condition starts to improve. General Instructions  Keep all follow-up visits as told by your health care provider. This is important.  Avoid the substance that caused your reaction. If you do not know what caused it, keep a journal to try to track what caused it. Write down:  What you eat.  What cosmetic products you use.  What you  drink.  What you wear in the affected area. This includes jewelry.  If you were given a dressing, take care of it as told by your health care provider. This includes when to change and remove it. SEEK MEDICAL CARE IF:   Your condition does not improve with treatment.  Your condition gets worse.  You have signs of infection such as swelling, tenderness, redness, soreness, or warmth in the affected area.  You have a fever.  You have new symptoms. SEEK IMMEDIATE MEDICAL CARE IF:   You have a severe headache, neck pain, or neck stiffness.  You vomit.  You feel very sleepy.  You notice red streaks coming from the affected area.  Your bone or joint underneath the affected area becomes painful after the skin has healed.  The affected area turns darker.  You have difficulty breathing.   This information is  not intended to replace advice given to you by your health care provider. Make sure you discuss any questions you have with your health care provider.   Document Released: 03/29/2000 Document Revised: 12/21/2014 Document Reviewed: 08/17/2014 Elsevier Interactive Patient Education [employer].

## 2016-05-16 DIAGNOSIS — N951 Menopausal and female climacteric states: Secondary | ICD-10-CM | POA: Diagnosis not present

## 2016-05-16 DIAGNOSIS — Z131 Encounter for screening for diabetes mellitus: Secondary | ICD-10-CM | POA: Diagnosis not present

## 2016-07-15 DIAGNOSIS — N951 Menopausal and female climacteric states: Secondary | ICD-10-CM | POA: Diagnosis not present

## 2016-07-15 DIAGNOSIS — Z131 Encounter for screening for diabetes mellitus: Secondary | ICD-10-CM | POA: Diagnosis not present

## 2016-07-18 ENCOUNTER — Other Ambulatory Visit: Payer: Self-pay | Admitting: Internal Medicine

## 2016-07-18 DIAGNOSIS — Z1231 Encounter for screening mammogram for malignant neoplasm of breast: Secondary | ICD-10-CM | POA: Diagnosis not present

## 2016-07-18 DIAGNOSIS — Z23 Encounter for immunization: Secondary | ICD-10-CM | POA: Diagnosis not present

## 2016-07-18 DIAGNOSIS — Z0000 Encounter for general adult medical examination without abnormal findings: Secondary | ICD-10-CM | POA: Diagnosis not present

## 2016-08-13 ENCOUNTER — Ambulatory Visit
Admission: RE | Admit: 2016-08-13 | Discharge: 2016-08-13 | Disposition: A | Discharge status: Home / Self Care | Payer: 59 | Source: Ambulatory Visit | Attending: Internal Medicine | Admitting: Internal Medicine

## 2016-08-13 DIAGNOSIS — Z1231 Encounter for screening mammogram for malignant neoplasm of breast: Secondary | ICD-10-CM | POA: Diagnosis not present

## 2016-09-10 DIAGNOSIS — Z8371 Family history of colonic polyps: Secondary | ICD-10-CM | POA: Diagnosis not present

## 2016-09-10 DIAGNOSIS — Z1211 Encounter for screening for malignant neoplasm of colon: Secondary | ICD-10-CM | POA: Diagnosis not present

## 2016-09-10 DIAGNOSIS — Z800 Family history of malignant neoplasm of digestive organs: Secondary | ICD-10-CM | POA: Diagnosis not present

## 2016-12-08 IMAGING — CT CT HEAD W/O CM
1 series · 16 of 30 positions shown, 20 images · non-contrast
Comparison: None.

CLINICAL DATA: Severe headache for 2 weeks.

EXAM:
CT HEAD WITHOUT CONTRAST
TECHNIQUE: Contiguous axial images were obtained from the base of the skull
through the vertex without intravenous contrast.

[Series 2: head wo · axial · 0.41mm/px · z∈[-99,+36]mm · 16 of 31 slices shown, 20 images]
[im 2/31  brain]
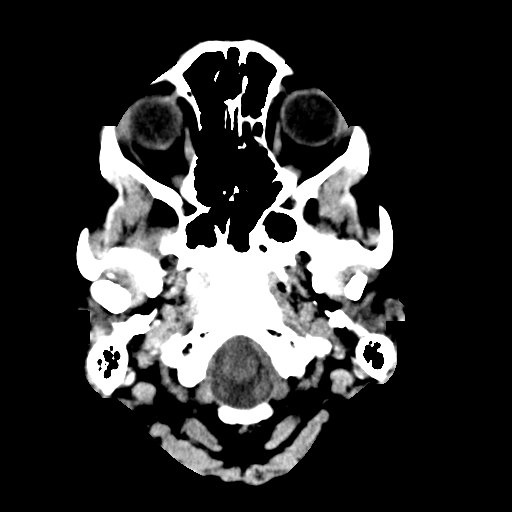
[im 2/31  bone]
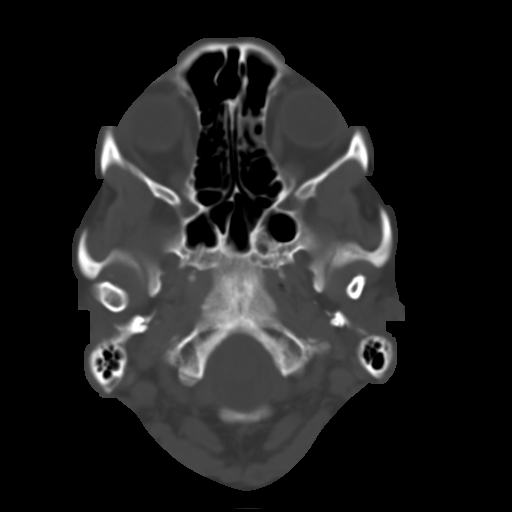
[im 4/31  brain]
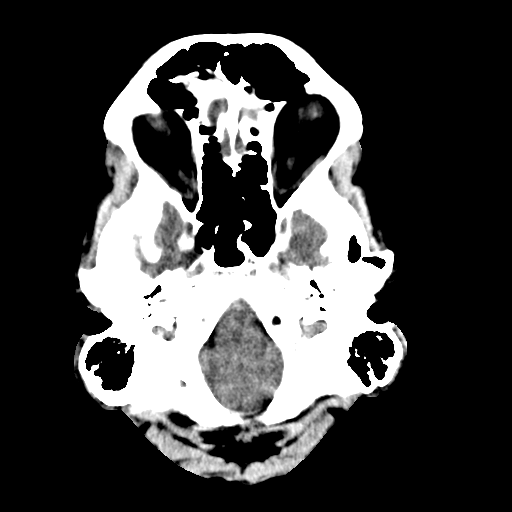
[im 6/31  brain]
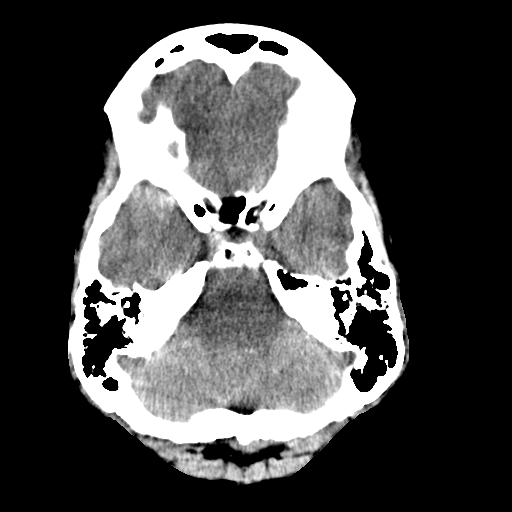
[im 8/31  brain]
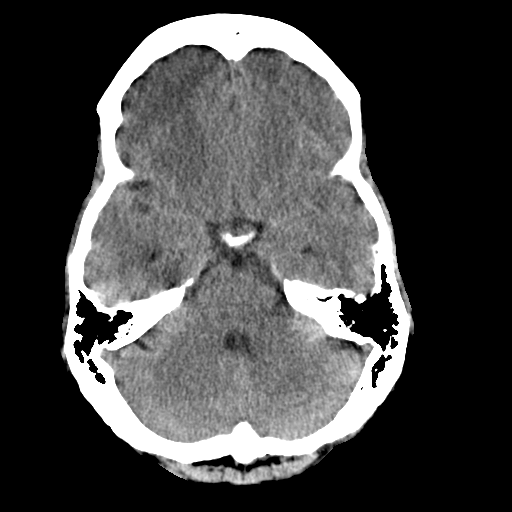
[im 9/31  brain]
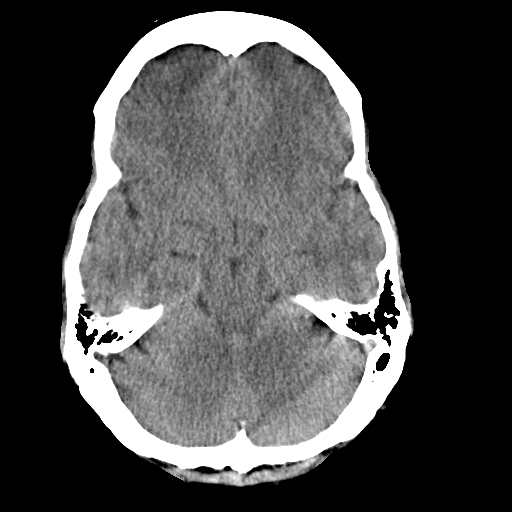
[im 9/31  bone]
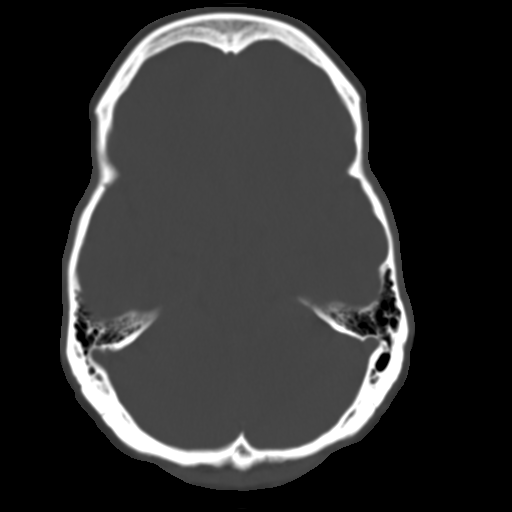
[im 11/31  brain]
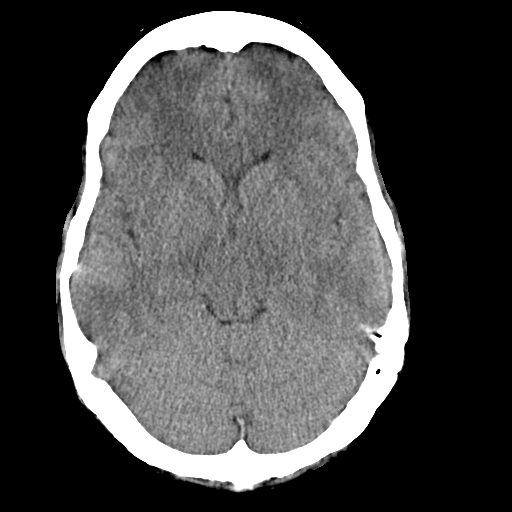
[im 13/31  brain]
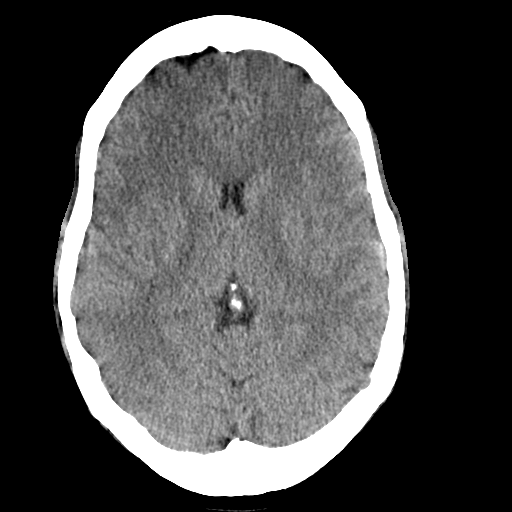
[im 15/31  brain]
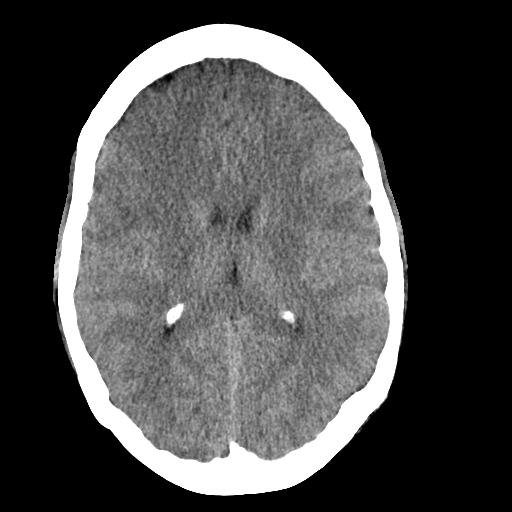
[im 16/31  brain]
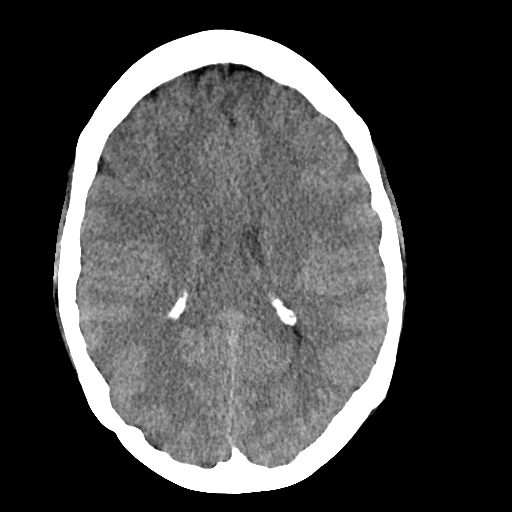
[im 16/31  bone]
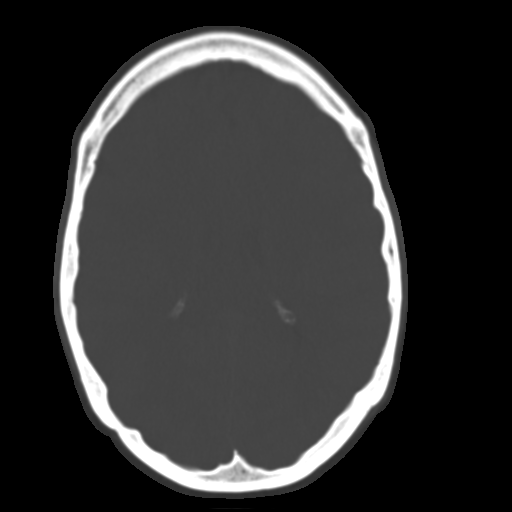
[im 18/31  brain]
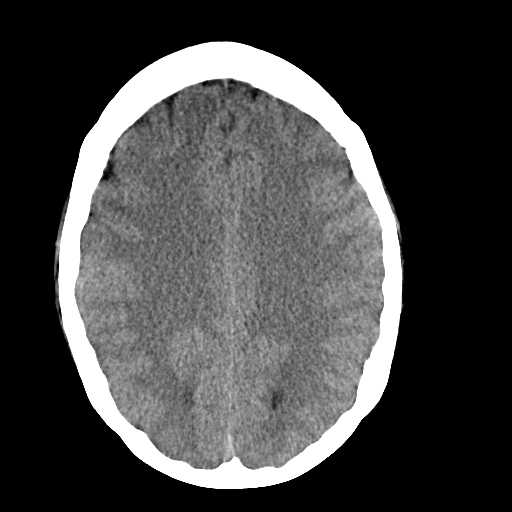
[im 20/31  brain]
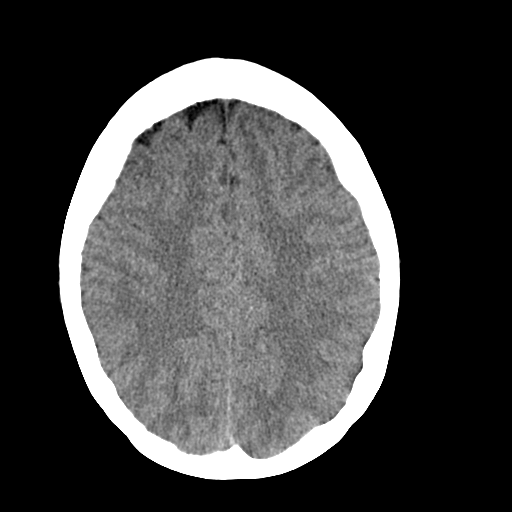
[im 22/31  brain]
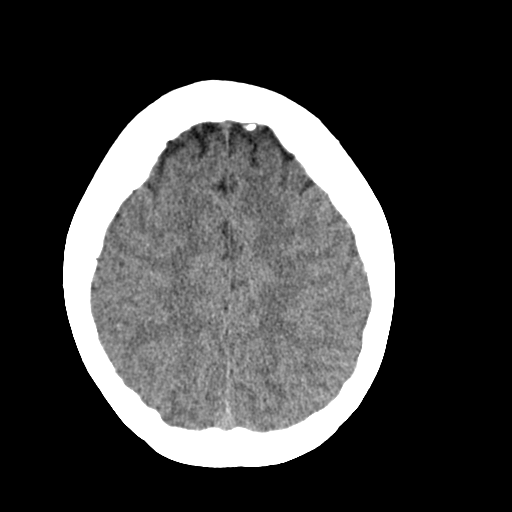
[im 23/31  brain]
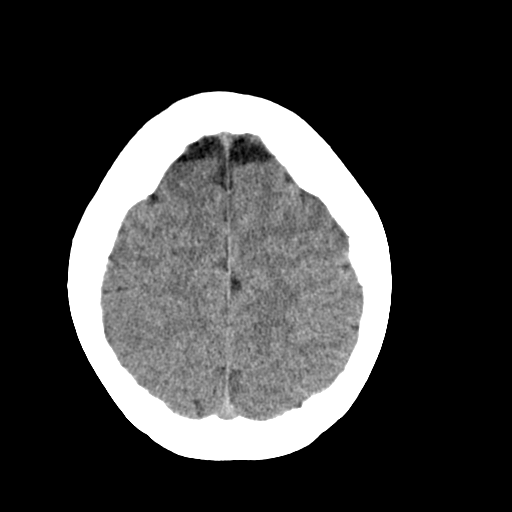
[im 23/31  bone]
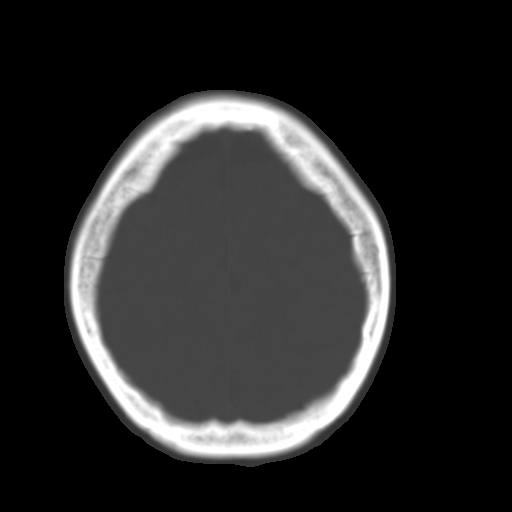
[im 25/31  brain]
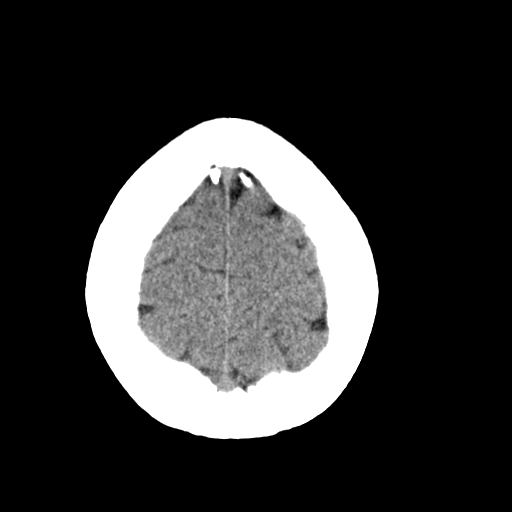
[im 27/31  brain]
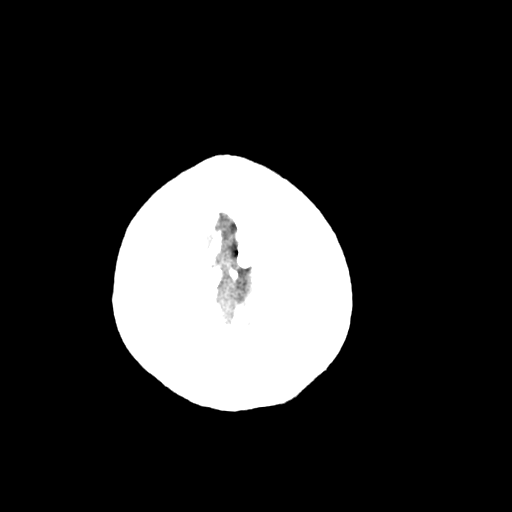
[im 29/31  brain]
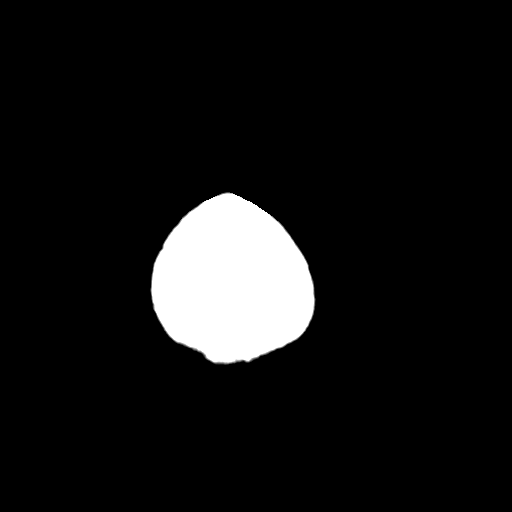

[16 of 30 positions shown; findings below may reference images not displayed]

FINDINGS: Skull and Sinuses:Negative for fracture or destructive process.

Mild mucosal thickening in the bilateral anterior ethmoids.

Visualized orbits: Negative.

Brain: Normal. No evidence of acute infarction, hemorrhage,
hydrocephalus, or mass lesion/mass effect.
IMPRESSION: 1. Negative intracranial imaging.
2. Mild bilateral ethmoid sinusitis.

## 2017-01-24 DIAGNOSIS — Z23 Encounter for immunization: Secondary | ICD-10-CM | POA: Diagnosis not present

## 2017-02-04 DIAGNOSIS — R0989 Other specified symptoms and signs involving the circulatory and respiratory systems: Secondary | ICD-10-CM | POA: Diagnosis not present

## 2017-05-12 DIAGNOSIS — N951 Menopausal and female climacteric states: Secondary | ICD-10-CM | POA: Diagnosis not present

## 2017-05-12 DIAGNOSIS — R030 Elevated blood-pressure reading, without diagnosis of hypertension: Secondary | ICD-10-CM | POA: Diagnosis not present

## 2017-08-04 DIAGNOSIS — R030 Elevated blood-pressure reading, without diagnosis of hypertension: Secondary | ICD-10-CM | POA: Diagnosis not present

## 2017-08-11 DIAGNOSIS — Z0000 Encounter for general adult medical examination without abnormal findings: Secondary | ICD-10-CM | POA: Diagnosis not present

## 2017-08-11 DIAGNOSIS — N951 Menopausal and female climacteric states: Secondary | ICD-10-CM | POA: Diagnosis not present

## 2017-08-11 DIAGNOSIS — R7303 Prediabetes: Secondary | ICD-10-CM | POA: Diagnosis not present

## 2018-02-09 DIAGNOSIS — N951 Menopausal and female climacteric states: Secondary | ICD-10-CM | POA: Diagnosis not present

## 2018-02-09 DIAGNOSIS — R002 Palpitations: Secondary | ICD-10-CM | POA: Diagnosis not present

## 2018-03-20 DIAGNOSIS — B9689 Other specified bacterial agents as the cause of diseases classified elsewhere: Secondary | ICD-10-CM | POA: Diagnosis not present

## 2018-03-20 DIAGNOSIS — R6889 Other general symptoms and signs: Secondary | ICD-10-CM | POA: Diagnosis not present

## 2018-03-20 DIAGNOSIS — J019 Acute sinusitis, unspecified: Secondary | ICD-10-CM | POA: Diagnosis not present

## 2018-04-10 DIAGNOSIS — Z23 Encounter for immunization: Secondary | ICD-10-CM | POA: Diagnosis not present

## 2018-04-10 DIAGNOSIS — R7303 Prediabetes: Secondary | ICD-10-CM | POA: Diagnosis not present

## 2018-04-10 DIAGNOSIS — N951 Menopausal and female climacteric states: Secondary | ICD-10-CM | POA: Diagnosis not present

## 2018-12-03 ENCOUNTER — Other Ambulatory Visit: Payer: Self-pay | Admitting: Internal Medicine

## 2018-12-03 DIAGNOSIS — Z1231 Encounter for screening mammogram for malignant neoplasm of breast: Secondary | ICD-10-CM

## 2019-01-11 ENCOUNTER — Ambulatory Visit
Admission: RE | Admit: 2019-01-11 | Discharge: 2019-01-11 | Disposition: A | Discharge status: Home / Self Care | Payer: 59 | Source: Ambulatory Visit | Attending: Internal Medicine | Admitting: Internal Medicine

## 2019-01-11 DIAGNOSIS — Z1231 Encounter for screening mammogram for malignant neoplasm of breast: Secondary | ICD-10-CM | POA: Diagnosis not present

## 2019-01-11 HISTORY — DX: Malignant (primary) neoplasm, unspecified: C80.1

## 2020-02-18 ENCOUNTER — Other Ambulatory Visit: Payer: Self-pay | Admitting: Internal Medicine

## 2020-02-18 DIAGNOSIS — Z1231 Encounter for screening mammogram for malignant neoplasm of breast: Secondary | ICD-10-CM

## 2020-03-30 ENCOUNTER — Ambulatory Visit
Admission: RE | Admit: 2020-03-30 | Discharge: 2020-03-30 | Disposition: A | Discharge status: Home / Self Care | Payer: 59 | Source: Ambulatory Visit | Attending: Internal Medicine | Admitting: Internal Medicine

## 2020-03-30 ENCOUNTER — Other Ambulatory Visit: Payer: Self-pay

## 2020-03-30 DIAGNOSIS — Z1231 Encounter for screening mammogram for malignant neoplasm of breast: Secondary | ICD-10-CM

## 2021-02-28 ENCOUNTER — Other Ambulatory Visit: Payer: Self-pay | Admitting: Internal Medicine

## 2021-02-28 DIAGNOSIS — Z1231 Encounter for screening mammogram for malignant neoplasm of breast: Secondary | ICD-10-CM

## 2021-05-24 ENCOUNTER — Ambulatory Visit
Admission: RE | Admit: 2021-05-24 | Discharge: 2021-05-24 | Disposition: A | Discharge status: Home / Self Care | Payer: 59 | Source: Ambulatory Visit | Attending: Internal Medicine | Admitting: Internal Medicine

## 2021-05-24 ENCOUNTER — Other Ambulatory Visit: Payer: Self-pay

## 2021-05-24 DIAGNOSIS — Z1231 Encounter for screening mammogram for malignant neoplasm of breast: Secondary | ICD-10-CM | POA: Diagnosis present

## 2021-11-04 IMAGING — MG DIGITAL SCREENING BILAT W/ TOMO W/ CAD
8 series · 8 of 24 positions shown · non-contrast
Comparison: Previous exam(s).

CLINICAL DATA: Screening.

EXAM:
DIGITAL SCREENING BILATERAL MAMMOGRAM WITH TOMO AND CAD

[R MLO synth-2D]
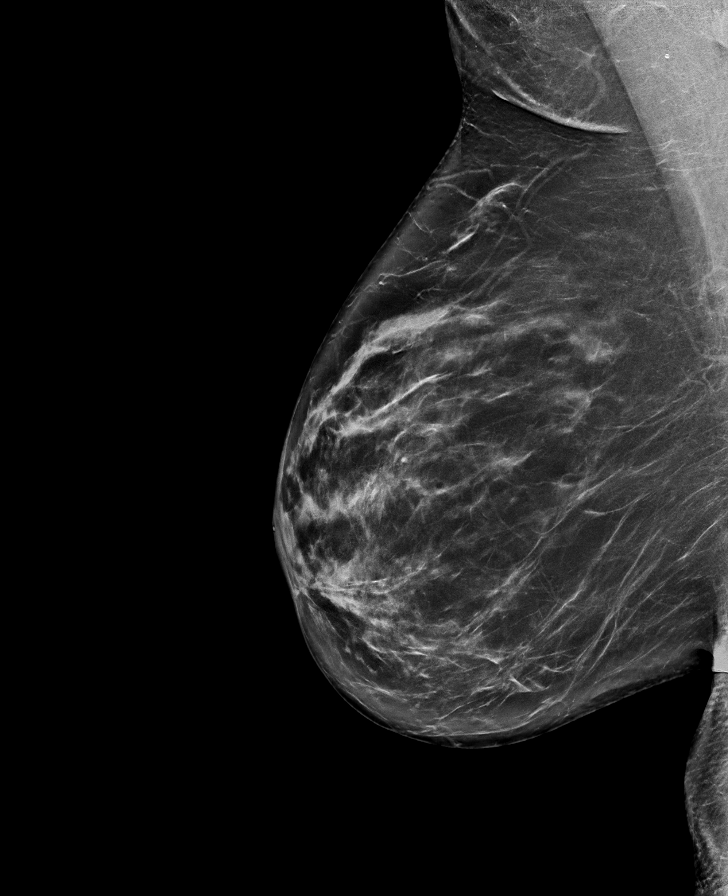

[L MLO synth-2D]
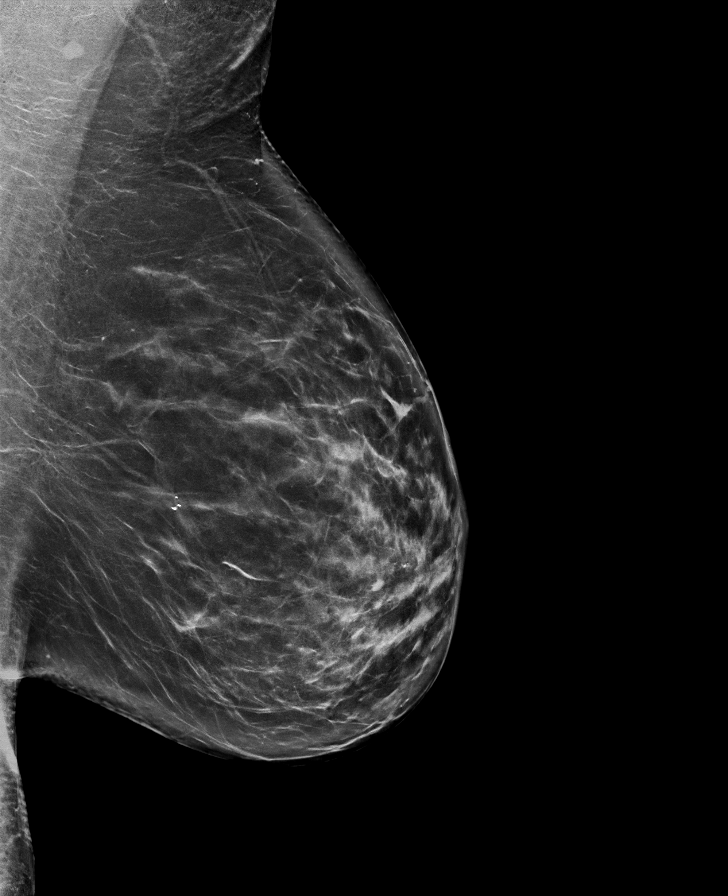

[R CC synth-2D]
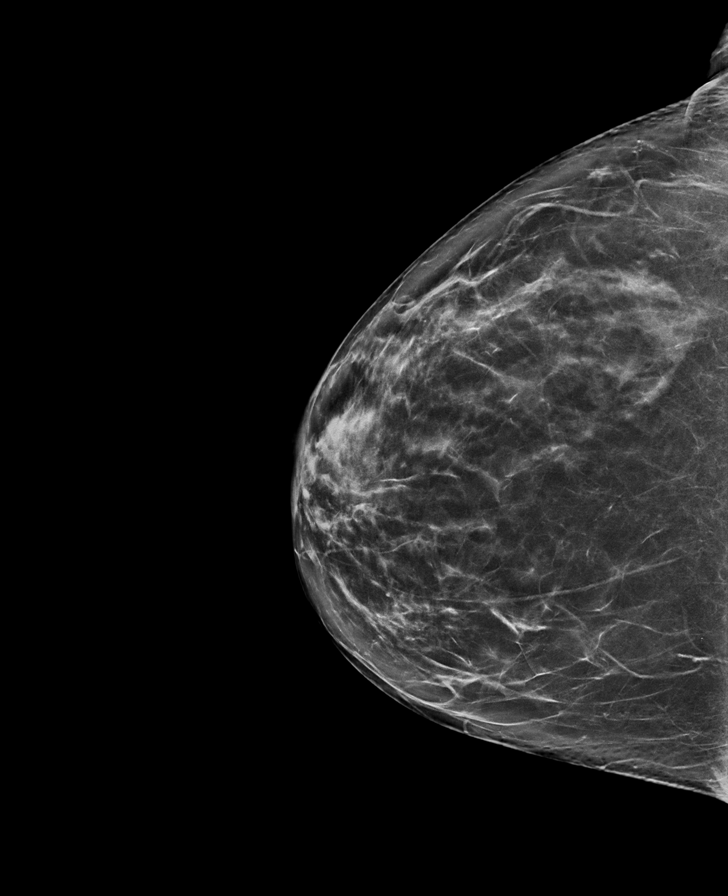

[L CC synth-2D]
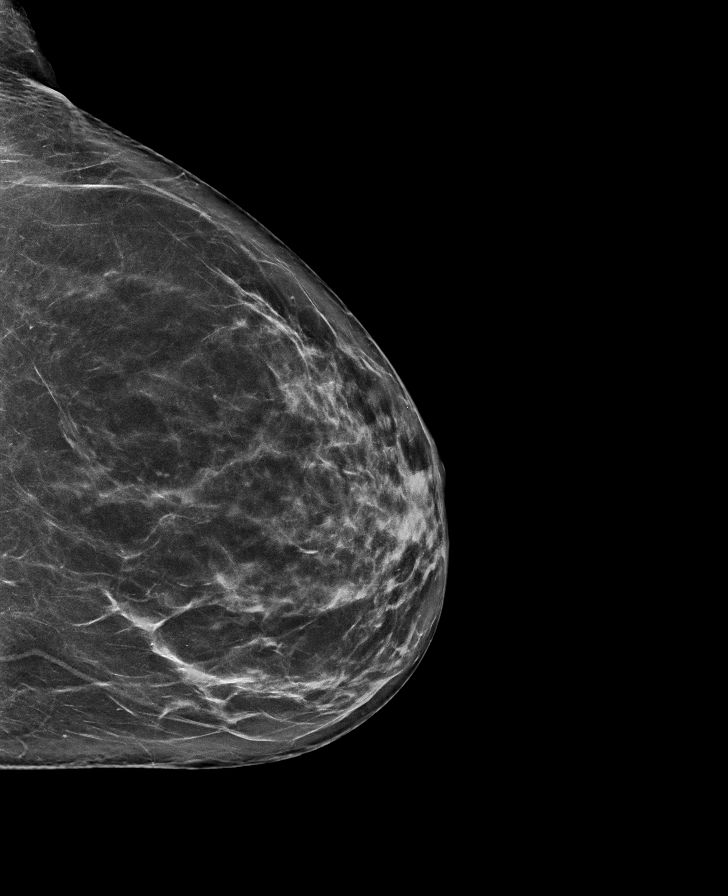

[R MLO tomo · tomo slice 45/88.0]
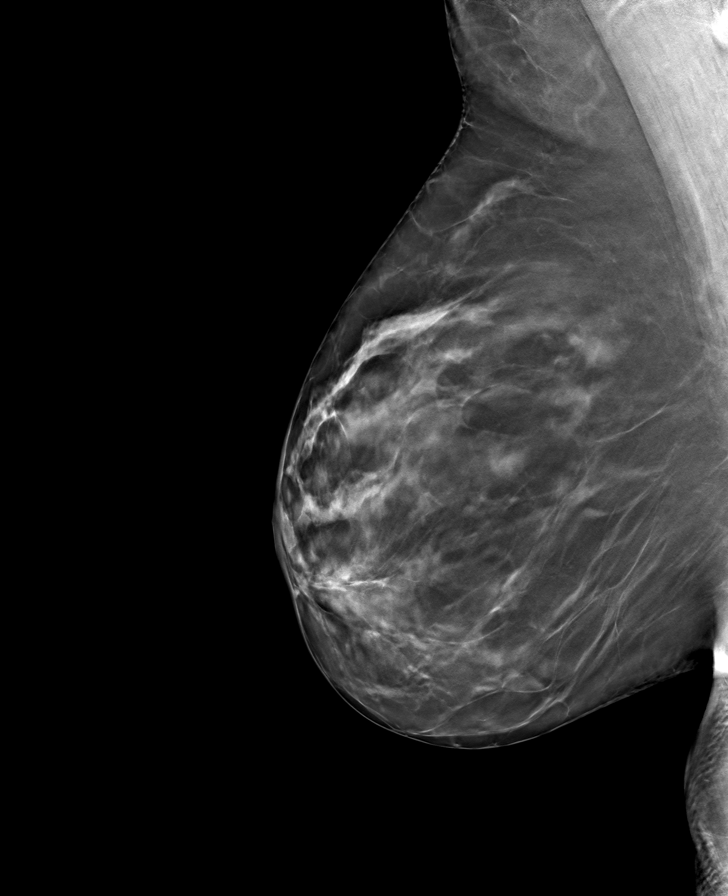

[R CC tomo · tomo slice 42/83.0]
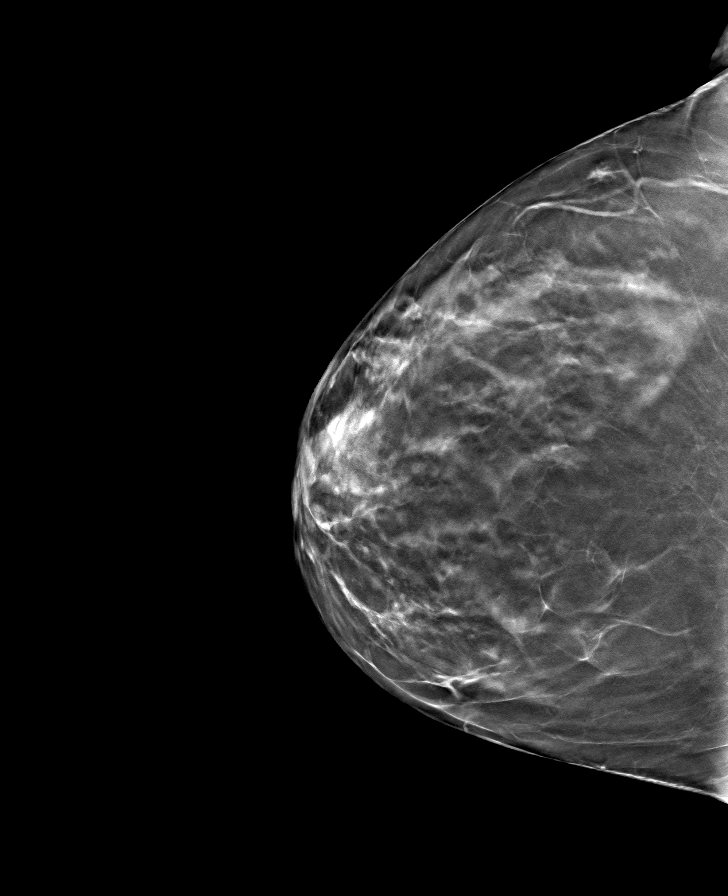

[L CC tomo · tomo slice 43/84.0]
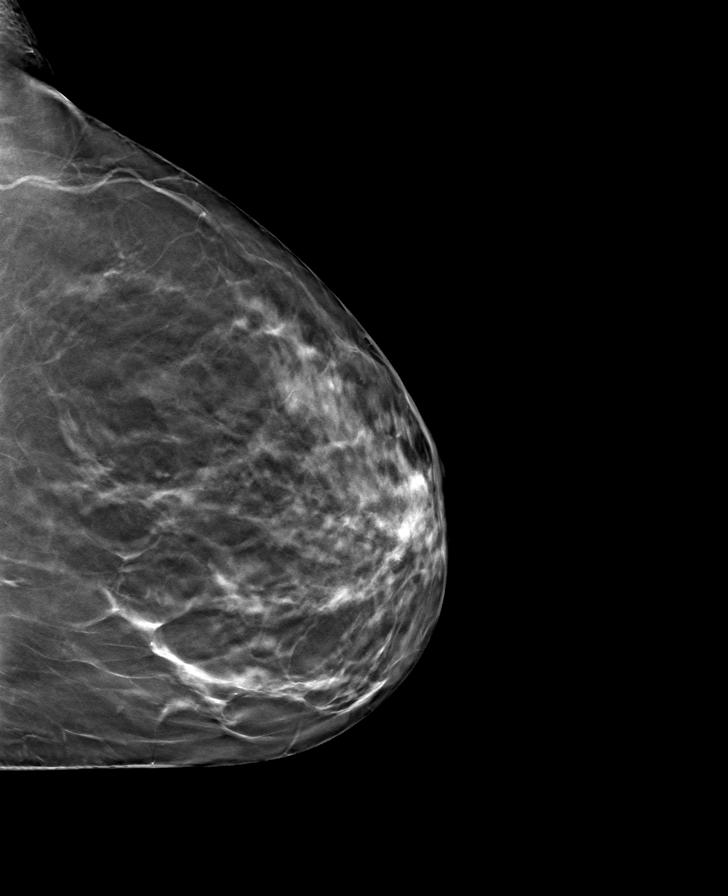

[L MLO tomo · tomo slice 47/92.0]
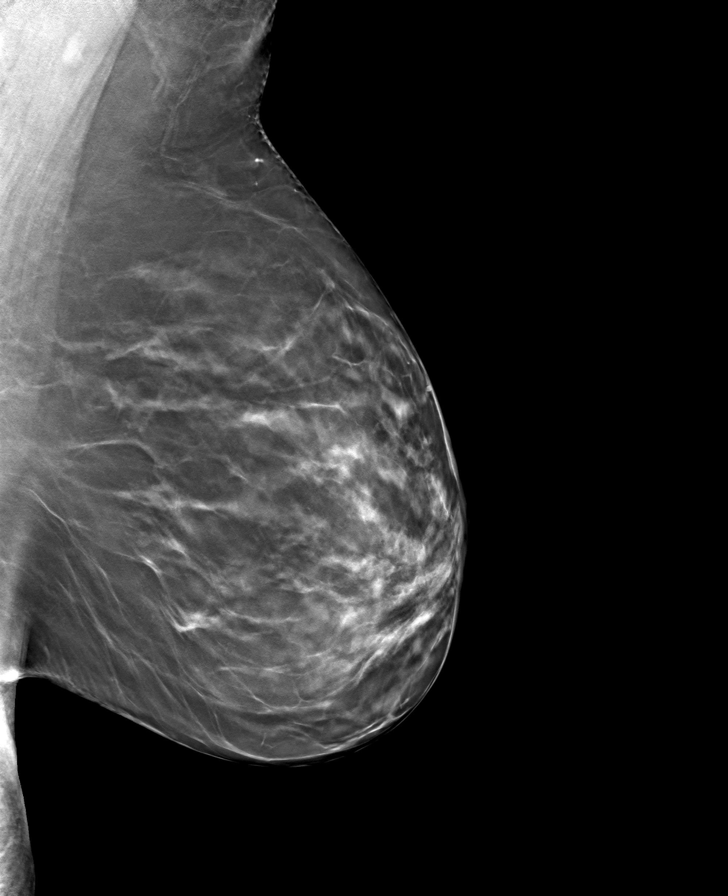

[8 of 24 positions shown; findings below may reference images not displayed]

ACR Breast Density Category c: The breast tissue is heterogeneously
dense, which may obscure small masses.
FINDINGS: There are no findings suspicious for malignancy. Images were
processed with CAD.
IMPRESSION: No mammographic evidence of malignancy. A result letter of this
screening mammogram will be mailed directly to the patient.

RECOMMENDATION:
Screening mammogram in one year. (Code:[id number]-U-LHB)

BI-RADS CATEGORY  1: Negative.

## 2022-04-24 ENCOUNTER — Other Ambulatory Visit: Payer: Self-pay | Admitting: Internal Medicine

## 2022-04-24 DIAGNOSIS — Z1231 Encounter for screening mammogram for malignant neoplasm of breast: Secondary | ICD-10-CM

## 2022-05-27 ENCOUNTER — Ambulatory Visit
Admission: RE | Admit: 2022-05-27 | Discharge: 2022-05-27 | Disposition: A | Discharge status: Home / Self Care | Payer: 59 | Source: Ambulatory Visit | Attending: Internal Medicine | Admitting: Internal Medicine

## 2022-05-27 DIAGNOSIS — Z1231 Encounter for screening mammogram for malignant neoplasm of breast: Secondary | ICD-10-CM

## 2023-04-30 ENCOUNTER — Other Ambulatory Visit: Payer: Self-pay | Admitting: Internal Medicine

## 2023-04-30 DIAGNOSIS — Z1231 Encounter for screening mammogram for malignant neoplasm of breast: Secondary | ICD-10-CM

## 2023-05-30 ENCOUNTER — Ambulatory Visit
Admission: RE | Admit: 2023-05-30 | Discharge: 2023-05-30 | Disposition: A | Discharge status: Home / Self Care | Payer: 59 | Source: Ambulatory Visit | Attending: Internal Medicine | Admitting: Internal Medicine

## 2023-05-30 DIAGNOSIS — Z1231 Encounter for screening mammogram for malignant neoplasm of breast: Secondary | ICD-10-CM | POA: Insufficient documentation

## 2023-06-06 ENCOUNTER — Encounter: Payer: Self-pay | Admitting: Internal Medicine

## 2023-06-11 ENCOUNTER — Encounter: Payer: Self-pay | Admitting: Internal Medicine

## 2023-06-18 ENCOUNTER — Encounter
Admission: RE | Disposition: A | Discharge status: Home / Self Care | Payer: Self-pay | Source: Home / Self Care | Attending: Internal Medicine

## 2023-06-18 ENCOUNTER — Ambulatory Visit: Payer: Self-pay | Admitting: Anesthesiology

## 2023-06-18 ENCOUNTER — Encounter: Payer: Self-pay | Admitting: Internal Medicine

## 2023-06-18 ENCOUNTER — Ambulatory Visit
Admission: RE | Admit: 2023-06-18 | Discharge: 2023-06-18 | Disposition: A | Discharge status: Home / Self Care | Payer: 59 | Attending: Internal Medicine | Admitting: Internal Medicine

## 2023-06-18 DIAGNOSIS — Z87891 Personal history of nicotine dependence: Secondary | ICD-10-CM | POA: Insufficient documentation

## 2023-06-18 DIAGNOSIS — K640 First degree hemorrhoids: Secondary | ICD-10-CM | POA: Diagnosis not present

## 2023-06-18 DIAGNOSIS — Z800 Family history of malignant neoplasm of digestive organs: Secondary | ICD-10-CM | POA: Insufficient documentation

## 2023-06-18 DIAGNOSIS — K5730 Diverticulosis of large intestine without perforation or abscess without bleeding: Secondary | ICD-10-CM | POA: Diagnosis not present

## 2023-06-18 DIAGNOSIS — K621 Rectal polyp: Secondary | ICD-10-CM | POA: Diagnosis not present

## 2023-06-18 DIAGNOSIS — Z1211 Encounter for screening for malignant neoplasm of colon: Secondary | ICD-10-CM | POA: Insufficient documentation

## 2023-06-18 HISTORY — DX: Pure hypercholesterolemia, unspecified: E78.00

## 2023-06-18 HISTORY — DX: Headache, unspecified: R51.9

## 2023-06-18 HISTORY — DX: Insomnia, unspecified: G47.00

## 2023-06-18 HISTORY — DX: Anxiety disorder, unspecified: F41.9

## 2023-06-18 HISTORY — PX: COLONOSCOPY WITH PROPOFOL: SHX5780

## 2023-06-18 HISTORY — DX: Prediabetes: R73.03

## 2023-06-18 HISTORY — DX: Palpitations: R00.2

## 2023-06-18 SURGERY — COLONOSCOPY WITH PROPOFOL
Anesthesia: General

## 2023-06-18 MED ORDER — LIDOCAINE HCL (CARDIAC) PF 100 MG/5ML IV SOSY
PREFILLED_SYRINGE | INTRAVENOUS | Status: DC | PRN
  Administered 2023-06-18: 100 mg via INTRAVENOUS

## 2023-06-18 MED ORDER — PROPOFOL 10 MG/ML IV BOLUS
INTRAVENOUS | Status: DC | PRN
  Administered 2023-06-18: 80 mg via INTRAVENOUS
  Administered 2023-06-18: 120 ug/kg/min via INTRAVENOUS

## 2023-06-18 MED ORDER — SODIUM CHLORIDE 0.9 % IV SOLN: INTRAVENOUS | Status: DC

## 2023-06-18 NOTE — Op Note (Signed)
 [facility] Patient Name: Paige Bond Procedure Date: 06/18/2023 2:27 PM MRN: [id number] Account #: [id number] Date of Birth: January 10, 1961 Admit Type: Outpatient Age: 63 Room: [facility] ENDO ROOM 2 Gender: Female Note Status: Finalized Instrument Name: Prentice Docker [id number] Procedure:             Colonoscopy Indications:           Colon cancer screening in patient at increased risk:                         Colorectal cancer in mother Providers:             Boykin Nearing. Norma Fredrickson MD, MD Referring MD:          Gracelyn Nurse, MD (Referring MD) Medicines:             Propofol per Anesthesia Complications:         No immediate complications. Estimated blood loss:                         Minimal. Procedure:             Pre-Anesthesia Assessment:                        - The risks and benefits of the procedure and the                         sedation options and risks were discussed with the                         patient. All questions were answered and informed                         consent was obtained.                        - Patient identification and proposed procedure were                         verified prior to the procedure by the nurse. The                         procedure was verified in the procedure room.                        - ASA Grade Assessment: II - A patient with mild                         systemic disease.                        - After reviewing the risks and benefits, the patient                         was deemed in satisfactory condition to undergo the                         procedure.                        After obtaining informed  consent, the colonoscope was                         passed under direct vision. Throughout the procedure,                         the patient's blood pressure, pulse, and oxygen                         saturations were monitored continuously. The                         Colonoscope was  introduced through the anus and                         advanced to the the cecum, identified by appendiceal                         orifice and ileocecal valve. The colonoscopy was                         performed without difficulty. The patient tolerated                         the procedure well. The quality of the bowel                         preparation was good. The ileocecal valve, appendiceal                         orifice, and rectum were photographed. Findings:      The perianal and digital rectal examinations were normal. Pertinent       negatives include normal sphincter tone and no palpable rectal lesions.      Non-bleeding internal hemorrhoids were found during retroflexion. The       hemorrhoids were Grade I (internal hemorrhoids that do not prolapse).      Two sessile polyps were found in the rectum. The polyps were 4 to 5 mm       in size. These polyps were removed with a jumbo cold forceps. Resection       and retrieval were complete.      A few small-mouthed diverticula were found in the sigmoid colon and       ascending colon.      The exam was otherwise without abnormality. Impression:            - Non-bleeding internal hemorrhoids.                        - Two 4 to 5 mm polyps in the rectum, removed with a                         jumbo cold forceps. Resected and retrieved.                        - Diverticulosis in the sigmoid colon and in the                         ascending colon.                        -  The examination was otherwise normal. Recommendation:        - Patient has a contact number available for                         emergencies. The signs and symptoms of potential                         delayed complications were discussed with the patient.                         Return to normal activities tomorrow. Written                         discharge instructions were provided to the patient.                        - Resume previous diet.                         - Continue present medications.                        - Repeat colonoscopy in 5 years for screening purposes.                        - Return to GI office PRN.                        - The findings and recommendations were discussed with                         the patient. Procedure Code(s):     --- Professional ---                        [phone], Colonoscopy, flexible; with biopsy, single or                         multiple Diagnosis Code(s):     --- Professional ---                        K57.30, Diverticulosis of large intestine without                         perforation or abscess without bleeding                        K64.0, First degree hemorrhoids                        D12.8, Benign neoplasm of rectum                        Z80.0, Family history of malignant neoplasm of                         digestive organs CPT copyright 2022 American Medical Association. All rights reserved. The codes documented in this report are preliminary and upon coder review may  be revised to meet current compliance requirements. Stanton Kidney MD, MD 06/18/2023 2:51:44 PM This report has  been signed electronically. Number of Addenda: 0 Note Initiated On: 06/18/2023 2:27 PM Scope Withdrawal Time: 0 hours 7 minutes 46 seconds  Total Procedure Duration: 0 hours 10 minutes 58 seconds  Estimated Blood Loss:  Estimated blood loss was minimal.      [facility]

## 2023-06-18 NOTE — H&P (Signed)
 Outpatient short stay form Pre-procedure 06/18/2023 1:15 PM Paige Bond K. Norma Fredrickson, M.D.  Primary Physician: Marcelino Duster, M.D.  Reason for visit:  Family history of colon cancer (Mother).  History of present illness:   63 year old patient presenting for family history of colon cancer. Patient denies any change in bowel habits, rectal bleeding or involuntary weight loss.    No current facility-administered medications for this encounter.  Medications Prior to Admission  Medication Sig Dispense Refill Last Dose/Taking   fluconazole (DIFLUCAN) 150 MG tablet Take 1 tablet (150 mg total) by mouth once. 2 tablet 1    levofloxacin (LEVAQUIN) 500 MG tablet Take 1 tablet (500 mg total) by mouth daily. 10 tablet 0    loratadine (CLARITIN) 10 MG tablet Take 1 tablet (10 mg total) by mouth daily as needed for allergies. Take 1 tablet in the morning. As needed for itching. 30 tablet 0    mupirocin ointment (BACTROBAN) 2 % Apply 1 application topically 3 (three) times daily. 22 g 0    mupirocin ointment (BACTROBAN) 2 % Apply three times a day for 10 days. 22 g 0    oseltamivir (TAMIFLU) 75 MG capsule Take 1 capsule (75 mg total) by mouth every 12 (twelve) hours. 10 capsule 0    phenazopyridine (PYRIDIUM) 200 MG tablet Take 1 tablet (200 mg total) by mouth 3 (three) times daily as needed for pain. 15 tablet 0    Phenylephrine-DM-GG-APAP (MUCINEX CHILD MULTI-SYMPTOM PO) Take by mouth.      predniSONE (STERAPRED UNI-PAK 21 TAB) 10 MG (21) TBPK tablet Sig 6 tablet day 1, 5 tablets day 2, 4 tablets day 3,,3tablets day 4, 2 tablets day 5, 1 tablet day 6 take all tablets orally 21 tablet 0    ranitidine (ZANTAC) 150 MG capsule Take 1 capsule (150 mg total) by mouth 2 (two) times daily as needed for heartburn. 60 capsule 0    sulfamethoxazole-trimethoprim (BACTRIM DS,SEPTRA DS) 800-160 MG tablet Take 1 tablet by mouth 2 (two) times daily. 20 tablet 0    SUMAtriptan (IMITREX) 100 MG tablet Take 1 tablet (100 mg  total) by mouth once. May repeat in 2 hours if headache persists or recurs.Can also take an hour before any activities that precipitates migraines. Maximum 2 tablets in 24 hours. 10 tablet 0    venlafaxine (EFFEXOR) 75 MG tablet Take 75 mg by mouth 2 (two) times daily.        Allergies  Allergen Reactions   Metoprolol Succinate [Metoprolol Tartrate] Other (See Comments)    FELT TIRED-DID NOT FEEL LIKE SELF     Past Medical History:  Diagnosis Date   Anxiety    Cancer (HCC)    skin   Headache    Insomnia    Intermittent palpitations    Menopausal problem    Pre-diabetes    Pure hypercholesterolemia     Review of systems:  Otherwise negative.    Physical Exam  Gen: Alert, oriented. Appears stated age.  HEENT: [place]/AT. PERRLA. Lungs: CTA, no wheezes. CV: RR nl S1, S2. Abd: soft, benign, no masses. BS+ Ext: No edema. Pulses 2+    Planned procedures: Proceed with colonoscopy. The patient understands the nature of the planned procedure, indications, risks, alternatives and potential complications including but not limited to bleeding, infection, perforation, damage to internal organs and possible oversedation/side effects from anesthesia. The patient agrees and gives consent to proceed.  Please refer to procedure notes for findings, recommendations and patient disposition/instructions.  Estil Vallee K. Norma Fredrickson, M.D. Gastroenterology 06/18/2023  1:15 PM

## 2023-06-18 NOTE — Interval H&P Note (Signed)
 History and Physical Interval Note:  06/18/2023 1:16 PM  [facility]  has presented today for surgery, with the diagnosis of Z12.11 (ICD-10-CM) - Colon cancer screening Z80.0 (ICD-10-CM) - Family history of colon cancer Z83.719 (ICD-10-CM) - Family hx colonic polyps.  The various methods of treatment have been discussed with the patient and family. After consideration of risks, benefits and other options for treatment, the patient has consented to  Procedure(s): COLONOSCOPY WITH PROPOFOL (N/A) as a surgical intervention.  The patient's history has been reviewed, patient examined, no change in status, stable for surgery.  I have reviewed the patient's chart and labs.  Questions were answered to the patient's satisfaction.     [place], [place]

## 2023-06-18 NOTE — Transfer of Care (Signed)
 Immediate Anesthesia Transfer of Care Note  Patient: Paige Bond  Procedure(s) Performed: COLONOSCOPY WITH PROPOFOL  Patient Location: PACU  Anesthesia Type:MAC  Level of Consciousness: awake, alert , and oriented  Airway & Oxygen Therapy: Patient Spontanous Breathing  Post-op Assessment: Report given to RN and Post -op Vital signs reviewed and stable  Post vital signs: stable  Last Vitals:  Vitals Value Taken Time  BP 104/72 06/18/23 1453  Temp 35.7 C 06/18/23 1452  Pulse 80 06/18/23 1453  Resp 10 06/18/23 1453  SpO2 100 % 06/18/23 1453  Vitals shown include unfiled device data.  Last Pain:  Vitals:   06/18/23 1452  TempSrc: Temporal  PainSc: 0-No pain         Complications: No notable events documented.

## 2023-06-18 NOTE — Anesthesia Preprocedure Evaluation (Signed)
 Anesthesia Evaluation  Patient identified by MRN, date of birth, ID band Patient awake    Reviewed: Allergy & Precautions, NPO status , Patient's Chart, lab work & pertinent test results  History of Anesthesia Complications Negative for: history of anesthetic complications  Airway Mallampati: III  TM Distance: <3 FB Neck ROM: full    Dental  (+) Chipped   Pulmonary neg pulmonary ROS, neg shortness of breath, former smoker   Pulmonary exam normal        Cardiovascular Exercise Tolerance: Good (-) angina negative cardio ROS Normal cardiovascular exam     Neuro/Psych  Headaches  negative psych ROS   GI/Hepatic negative GI ROS, Neg liver ROS,neg GERD  ,,  Endo/Other  negative endocrine ROS    Renal/GU negative Renal ROS  negative genitourinary   Musculoskeletal   Abdominal   Peds  Hematology negative hematology ROS (+)   Anesthesia Other Findings Past Medical History: No date: Anxiety No date: Cancer [facility])     Comment:  skin No date: Headache No date: Insomnia No date: Intermittent palpitations No date: Menopausal problem No date: Pre-diabetes No date: Pure hypercholesterolemia  Past Surgical History: No date: COLONOSCOPY; N/A No date: KNEE SURGERY; Left No date: UPPER GASTROINTESTINAL ENDOSCOPY; N/A  BMI    Body Mass Index: 25.88 kg/m      Reproductive/Obstetrics negative OB ROS                             Anesthesia Physical Anesthesia Plan  ASA: 2  Anesthesia Plan: General   Post-op Pain Management:    Induction: Intravenous  PONV Risk Score and Plan: Propofol infusion and TIVA  Airway Management Planned: Natural Airway and Nasal Cannula  Additional Equipment:   Intra-op Plan:   Post-operative Plan:   Informed Consent: I have reviewed the patients History and Physical, chart, labs and discussed the procedure including the risks, benefits and alternatives for  the proposed anesthesia with the patient or authorized representative who has indicated his/her understanding and acceptance.     Dental Advisory Given  Plan Discussed with: Anesthesiologist, CRNA and Surgeon  Anesthesia Plan Comments: (Patient consented for risks of anesthesia including but not limited to:  - adverse reactions to medications - risk of airway placement if required - damage to eyes, teeth, lips or other oral mucosa - nerve damage due to positioning  - sore throat or hoarseness - Damage to heart, brain, nerves, lungs, other parts of body or loss of life  Patient voiced understanding and assent.)       Anesthesia Quick Evaluation

## 2023-06-19 ENCOUNTER — Encounter: Payer: Self-pay | Admitting: Internal Medicine

## 2023-06-19 LAB — SURGICAL PATHOLOGY

## 2023-06-19 NOTE — Anesthesia Postprocedure Evaluation (Signed)
 Anesthesia Post Note  Patient: [employer]  Procedure(s) Performed: COLONOSCOPY WITH PROPOFOL  Patient location during evaluation: PACU Anesthesia Type: General Level of consciousness: awake and alert Pain management: pain level controlled Vital Signs Assessment: post-procedure vital signs reviewed and stable Respiratory status: spontaneous breathing, nonlabored ventilation and respiratory function stable Cardiovascular status: blood pressure returned to baseline and stable Postop Assessment: no apparent nausea or vomiting Anesthetic complications: no   No notable events documented.   Last Vitals:  Vitals:   06/18/23 1452 06/18/23 1504  BP: 104/72 111/74  Pulse: 80 75  Resp: 17 14  Temp: (!) 35.7 C   SpO2: 99% 100%    Last Pain:  Vitals:   06/19/23 0734  TempSrc:   PainSc: 0-No pain                 Foye Deer

## 2024-01-28 DIAGNOSIS — R7303 Prediabetes: Secondary | ICD-10-CM | POA: Diagnosis not present

## 2024-01-28 DIAGNOSIS — E7800 Pure hypercholesterolemia, unspecified: Secondary | ICD-10-CM | POA: Diagnosis not present

## 2024-02-04 DIAGNOSIS — F411 Generalized anxiety disorder: Secondary | ICD-10-CM | POA: Diagnosis not present

## 2024-02-04 DIAGNOSIS — Z0001 Encounter for general adult medical examination with abnormal findings: Secondary | ICD-10-CM | POA: Diagnosis not present

## 2024-02-04 DIAGNOSIS — E7800 Pure hypercholesterolemia, unspecified: Secondary | ICD-10-CM | POA: Diagnosis not present

## 2024-02-04 DIAGNOSIS — R7303 Prediabetes: Secondary | ICD-10-CM | POA: Diagnosis not present

## 2024-02-04 DIAGNOSIS — Z1339 Encounter for screening examination for other mental health and behavioral disorders: Secondary | ICD-10-CM | POA: Diagnosis not present

## 2024-02-04 DIAGNOSIS — G4700 Insomnia, unspecified: Secondary | ICD-10-CM | POA: Diagnosis not present

## 2024-02-04 DIAGNOSIS — Z1331 Encounter for screening for depression: Secondary | ICD-10-CM | POA: Diagnosis not present
